# Patient Record
Sex: Female | Born: 1960 | Race: White | Hispanic: No | Marital: Married | Smoking: Former smoker
Health system: Southern US, Community
[De-identification: ages and names within clinical notes are randomized; demographics above are authoritative.]

## PROBLEM LIST (undated history)

## (undated) DIAGNOSIS — R87619 Unspecified abnormal cytological findings in specimens from cervix uteri: Secondary | ICD-10-CM

## (undated) DIAGNOSIS — N390 Urinary tract infection, site not specified: Secondary | ICD-10-CM

## (undated) DIAGNOSIS — I209 Angina pectoris, unspecified: Secondary | ICD-10-CM

## (undated) DIAGNOSIS — E119 Type 2 diabetes mellitus without complications: Secondary | ICD-10-CM

## (undated) DIAGNOSIS — K219 Gastro-esophageal reflux disease without esophagitis: Secondary | ICD-10-CM

## (undated) DIAGNOSIS — IMO0002 Reserved for concepts with insufficient information to code with codable children: Secondary | ICD-10-CM

## (undated) DIAGNOSIS — J40 Bronchitis, not specified as acute or chronic: Secondary | ICD-10-CM

## (undated) DIAGNOSIS — F419 Anxiety disorder, unspecified: Secondary | ICD-10-CM

## (undated) DIAGNOSIS — J339 Nasal polyp, unspecified: Secondary | ICD-10-CM

## (undated) DIAGNOSIS — M1990 Unspecified osteoarthritis, unspecified site: Secondary | ICD-10-CM

## (undated) DIAGNOSIS — M25552 Pain in left hip: Secondary | ICD-10-CM

## (undated) DIAGNOSIS — J342 Deviated nasal septum: Secondary | ICD-10-CM

## (undated) DIAGNOSIS — K589 Irritable bowel syndrome without diarrhea: Secondary | ICD-10-CM

## (undated) DIAGNOSIS — I10 Essential (primary) hypertension: Secondary | ICD-10-CM

## (undated) HISTORY — DX: Irritable bowel syndrome, unspecified: K58.9

## (undated) HISTORY — PX: TUBAL LIGATION: SHX77

## (undated) HISTORY — DX: Gastro-esophageal reflux disease without esophagitis: K21.9

## (undated) HISTORY — DX: Unspecified abnormal cytological findings in specimens from cervix uteri: R87.619

## (undated) HISTORY — DX: Reserved for concepts with insufficient information to code with codable children: IMO0002

## (undated) HISTORY — PX: ABDOMINAL HYSTERECTOMY: SHX81

## (undated) HISTORY — PX: CERVICAL CONE BIOPSY: SUR198

## (undated) HISTORY — PX: ANTERIOR CERVICAL DECOMP/DISCECTOMY FUSION: SHX1161

---

## 1999-05-03 ENCOUNTER — Other Ambulatory Visit: Admission: RE | Admit: 1999-05-03 | Discharge: 1999-05-03 | Payer: Self-pay | Admitting: Obstetrics and Gynecology

## 2001-02-14 ENCOUNTER — Other Ambulatory Visit: Admission: RE | Admit: 2001-02-14 | Discharge: 2001-02-14 | Payer: Self-pay | Admitting: Obstetrics and Gynecology

## 2001-04-13 ENCOUNTER — Ambulatory Visit (HOSPITAL_COMMUNITY): Admission: RE | Admit: 2001-04-13 | Discharge: 2001-04-13 | Payer: Self-pay | Admitting: Family Medicine

## 2001-04-13 ENCOUNTER — Encounter: Payer: Self-pay | Admitting: Family Medicine

## 2001-07-05 ENCOUNTER — Inpatient Hospital Stay (HOSPITAL_COMMUNITY): Admission: AD | Admit: 2001-07-05 | Discharge: 2001-07-05 | Payer: Self-pay | Admitting: Obstetrics and Gynecology

## 2002-03-25 ENCOUNTER — Other Ambulatory Visit: Admission: RE | Admit: 2002-03-25 | Discharge: 2002-03-25 | Payer: Self-pay | Admitting: *Deleted

## 2003-02-01 ENCOUNTER — Emergency Department (HOSPITAL_COMMUNITY): Admission: EM | Admit: 2003-02-01 | Discharge: 2003-02-01 | Payer: Self-pay | Admitting: Emergency Medicine

## 2003-02-01 ENCOUNTER — Encounter: Payer: Self-pay | Admitting: Emergency Medicine

## 2003-05-26 ENCOUNTER — Encounter: Payer: Self-pay | Admitting: Family Medicine

## 2003-05-26 ENCOUNTER — Ambulatory Visit (HOSPITAL_COMMUNITY): Admission: RE | Admit: 2003-05-26 | Discharge: 2003-05-26 | Payer: Self-pay | Admitting: Family Medicine

## 2003-06-10 ENCOUNTER — Other Ambulatory Visit: Admission: RE | Admit: 2003-06-10 | Discharge: 2003-06-10 | Payer: Self-pay | Admitting: Obstetrics and Gynecology

## 2003-08-31 ENCOUNTER — Ambulatory Visit (HOSPITAL_COMMUNITY): Admission: RE | Admit: 2003-08-31 | Discharge: 2003-08-31 | Payer: Self-pay | Admitting: Obstetrics and Gynecology

## 2003-09-04 ENCOUNTER — Encounter: Admission: RE | Admit: 2003-09-04 | Discharge: 2003-09-04 | Payer: Self-pay | Admitting: Obstetrics and Gynecology

## 2004-07-07 ENCOUNTER — Other Ambulatory Visit: Admission: RE | Admit: 2004-07-07 | Discharge: 2004-07-07 | Payer: Self-pay | Admitting: Obstetrics and Gynecology

## 2004-10-03 ENCOUNTER — Encounter (INDEPENDENT_AMBULATORY_CARE_PROVIDER_SITE_OTHER): Payer: Self-pay | Admitting: *Deleted

## 2004-10-04 ENCOUNTER — Inpatient Hospital Stay (HOSPITAL_COMMUNITY): Admission: RE | Admit: 2004-10-04 | Discharge: 2004-10-05 | Payer: Self-pay | Admitting: Obstetrics and Gynecology

## 2006-10-04 ENCOUNTER — Encounter: Admission: RE | Admit: 2006-10-04 | Discharge: 2006-10-04 | Payer: Self-pay | Admitting: Obstetrics and Gynecology

## 2006-10-18 ENCOUNTER — Other Ambulatory Visit: Admission: RE | Admit: 2006-10-18 | Discharge: 2006-10-18 | Payer: Self-pay | Admitting: Obstetrics and Gynecology

## 2007-09-17 ENCOUNTER — Encounter: Admission: RE | Admit: 2007-09-17 | Discharge: 2007-09-17 | Payer: Self-pay | Admitting: Obstetrics and Gynecology

## 2010-03-11 ENCOUNTER — Emergency Department (HOSPITAL_COMMUNITY): Admission: EM | Admit: 2010-03-11 | Discharge: 2010-03-11 | Payer: Self-pay | Admitting: Family Medicine

## 2010-09-17 ENCOUNTER — Encounter: Payer: Self-pay | Admitting: Obstetrics and Gynecology

## 2010-11-12 LAB — POCT URINALYSIS DIP (DEVICE)
Ketones, ur: NEGATIVE mg/dL
Nitrite: POSITIVE — AB
Urobilinogen, UA: 0.2 mg/dL (ref 0.0–1.0)
pH: 5 (ref 5.0–8.0)

## 2010-12-22 ENCOUNTER — Other Ambulatory Visit (HOSPITAL_COMMUNITY): Payer: Self-pay | Admitting: Neurosurgery

## 2010-12-22 ENCOUNTER — Encounter (HOSPITAL_COMMUNITY)
Admission: RE | Admit: 2010-12-22 | Discharge: 2010-12-22 | Disposition: A | Discharge status: Home / Self Care | Payer: 59 | Source: Ambulatory Visit | Attending: Neurosurgery | Admitting: Neurosurgery

## 2010-12-22 DIAGNOSIS — M5020 Other cervical disc displacement, unspecified cervical region: Secondary | ICD-10-CM

## 2010-12-22 LAB — BASIC METABOLIC PANEL
BUN: 15 mg/dL (ref 6–23)
CO2: 29 mEq/L (ref 19–32)
Calcium: 9.9 mg/dL (ref 8.4–10.5)
Chloride: 99 mEq/L (ref 96–112)
Creatinine, Ser: 0.71 mg/dL (ref 0.4–1.2)
GFR calc non Af Amer: >60 mL/min (ref >60)

## 2010-12-22 LAB — SURGICAL PCR SCREEN
MRSA, PCR: NEGATIVE
Staphylococcus aureus: NEGATIVE

## 2010-12-22 LAB — CBC
MCH: 30.9 pg (ref 26.0–34.0)
MCHC: 34.8 g/dL (ref 30.0–36.0)
MCV: 88.9 fL (ref 78.0–100.0)

## 2010-12-26 ENCOUNTER — Ambulatory Visit (HOSPITAL_COMMUNITY): Payer: 59

## 2010-12-26 ENCOUNTER — Observation Stay (HOSPITAL_COMMUNITY)
Admission: RE | Admit: 2010-12-26 | Discharge: 2010-12-27 | Disposition: A | Discharge status: Home / Self Care | Payer: 59 | Source: Ambulatory Visit | Attending: Neurosurgery | Admitting: Neurosurgery

## 2010-12-26 DIAGNOSIS — Z01818 Encounter for other preprocedural examination: Secondary | ICD-10-CM | POA: Insufficient documentation

## 2010-12-26 DIAGNOSIS — M5000 Cervical disc disorder with myelopathy, unspecified cervical region: Principal | ICD-10-CM | POA: Insufficient documentation

## 2010-12-26 DIAGNOSIS — I10 Essential (primary) hypertension: Secondary | ICD-10-CM | POA: Insufficient documentation

## 2010-12-26 DIAGNOSIS — Z01810 Encounter for preprocedural cardiovascular examination: Secondary | ICD-10-CM | POA: Insufficient documentation

## 2010-12-26 DIAGNOSIS — E119 Type 2 diabetes mellitus without complications: Secondary | ICD-10-CM | POA: Insufficient documentation

## 2010-12-26 DIAGNOSIS — Z87891 Personal history of nicotine dependence: Secondary | ICD-10-CM | POA: Insufficient documentation

## 2010-12-26 DIAGNOSIS — K219 Gastro-esophageal reflux disease without esophagitis: Secondary | ICD-10-CM | POA: Insufficient documentation

## 2010-12-26 DIAGNOSIS — M4712 Other spondylosis with myelopathy, cervical region: Secondary | ICD-10-CM | POA: Insufficient documentation

## 2010-12-26 DIAGNOSIS — Z01812 Encounter for preprocedural laboratory examination: Secondary | ICD-10-CM | POA: Insufficient documentation

## 2010-12-26 LAB — GLUCOSE, CAPILLARY
Glucose-Capillary: 170 mg/dL — ABNORMAL HIGH (ref 70–99)
Glucose-Capillary: 118 mg/dL — ABNORMAL HIGH (ref 70–99)

## 2010-12-27 LAB — GLUCOSE, CAPILLARY: Glucose-Capillary: 153 mg/dL — ABNORMAL HIGH (ref 70–99)

## 2011-01-13 NOTE — Op Note (Signed)
NAME:  Barbara Lynch, Barbara Lynch               ACCOUNT NO.:  [id number]   MEDICAL RECORD NO.:  [id number]          PATIENT TYPE:  OBV   LOCATION:  9399                          FACILITY:  [facility]   PHYSICIAN:  Cynthia P. Romine, M.D.DATE OF BIRTH:  [birth year]   DATE OF PROCEDURE:  10/03/2004  DATE OF DISCHARGE:                                 OPERATIVE REPORT   PREOPERATIVE DIAGNOSES:  1.  Menorrhagia.  2.  Pelvic pain.  3.  Uterine fibroids.  4.  Bilateral ovarian cysts.   POSTOPERATIVE DIAGNOSES:  1.  Menorrhagia.  2.  Pelvic pain.  3.  Uterine fibroids.  4.  Bilateral paratubal cysts.   PROCEDURE:  1.  Total abdominal hysterectomy.  2.  Bilateral distal salpingectomy.   SURGEON:  Cynthia P. Romine, M.D.   ASSISTANT:  Andres Ege, M.D.   ANESTHESIA:  General endotracheal.   ESTIMATED BLOOD LOSS:  100 cc.   COMPLICATIONS:  None.   PROCEDURE:  The patient was taken to the operating room and, after the  induction of adequate general endotracheal anesthesia, was prepped and  draped in the usual fashion and a Foley catheter inserted.  A Pfannenstiel  incision was made and carried down to the fascia using the Bovie.  The  fascia was nicked and opened transversely.  Kochers were used to grasp the  fascial margins and were separated from the underlying rectus muscles using  cautery.  The rectus muscles were separated sharply in the midline.  The  underlying peritoneum was entered atraumatically and opened vertically.  The  upper abdomen was explored.  The liver edge was smoothed.  The gallbladder  contained no stones.  There was no periaortic or pelvic adenopathy.  In the  pelvis, the uterus was slightly enlarged.  There was an approximately 2 cm  posterior fundal fibroid.  The ovaries on both sides appeared normal.  The  tubes on both sides had clear paratubal cysts at the very distal ends.  The  one on the right was approximately 1.5 cm and the one on the left  approximately 2  cm.  The Balfour retractor was placed.  The intestines were  packed away.  The uterus was grasped at its cornu with long Kellys.  The  round ligaments on either side were switched and then divided with the  Bovie.  The anterior leaf of the broad ligament was taken down sharply.  The  pedicle containing the utero-ovarian ligaments and the tube was clamped,  cut, and doubly tied on each side.  The ureters were identified and brought  out of the operative field.  The uterine arteries were skeletonized.  The  bladder was taken down sharply.  The uterine arteries were clamped, cut, and  doubly tied on each side.  The hysterectomy continued down the cardinal  ligament in clamping, cutting, and tying sequence.  The uterosacral  ligaments were taken as a separate pedicle.  On the right, the distal end of  the cardinal ligament was clamped.  The vagina was entered.  Specimen was  removed with Mayo scissors.  Angles of sutures  were placed through each of  the right and left angle of the vagina.  The vagina was closed with  interrupted figure-of-eight sutures of 0 chromic.  Good hemostasis was  noted.  The pelvis was irrigated.   Attention was next turned to the tubes.  A Kelly clamp was used to clamp  across the distal end of each tube so that the fimbria and the paratubal  cysts were incorporated as a specimen.  The specimens were removed and sent  to pathology.  The pedicles were doubly tied with 0 chromic.  Good  hemostasis was noted.  The ovarian pedicles were then tied up to the round  ligament pedicles to try and prevent them from falling into the cul-de-sac  and causing dyspareunia.   Attention was next turned back into the pelvis.  The pelvis was hemostatic.  The Balfour retractor was removed.  The caps were removed.  The peritoneum  was grasped with hemostats and closed in a running fashion using 2-0  chromic.  The fascia was closed from each angle towards the midline using 0  Vicryl.  An  ON-Q catheter was placed in the subfascial space prior to  closing the fascia.  The subcutaneous tissue was irrigated.  Hemostasis was  achieved with the Bovie.  A subcu ON-Q catheter was placed.  The skin was  then closed with a subcuticular stitch of 4-0 Vicryl.  Sponge and Steri-  Strips were applied and a dressing was applied.  The procedure was  terminated.  Sponge, needle, and instrument counts were correct x3.      CPR/MEDQ  D:  10/03/2004  T:  10/03/2004  Job:  [id number]

## 2011-01-13 NOTE — Discharge Summary (Signed)
NAME:  Barbara Lynch, Barbara Lynch               ACCOUNT NO.:  [id number]   MEDICAL RECORD NO.:  [id number]          PATIENT TYPE:  INP   LOCATION:  9319                          FACILITY:  [facility]   PHYSICIAN:  Cynthia P. Romine, M.D.DATE OF BIRTH:  [birth year]   DATE OF ADMISSION:  10/04/2004  DATE OF DISCHARGE:  10/05/2004                                 DISCHARGE SUMMARY   DISCHARGE DIAGNOSES:  Adenomyosis, leiomyoma, benign cystadenofibroma of the  right fallopian tube and a benign paratubal cyst of the left fallopian tube.   HISTORY:  This is a 50 year old, married white female, gravida 2, para 2  with menorrhagia unresponsive to medical management. Her uterus is known to  be 9 cm with a 2.1 cm fibroid and ovaries both had up to 2 cm cyst that was  all consistent with endometriosis. The patient elected definitive surgery.  Her history was positive for C sections x2 and a BTSP.  She was 5 foot 4 and  226 pounds on exam.  On October 03, 2004, the patient was taken to the  operating room and underwent a total abdominal hysterectomy and a bilateral  distal salpingectomy. The uterus was enlarged, there was a 2 cm posterior  fundal fibroid, the tubes both had clear paratubal cysts at the distal end.  The estimated blood loss for the surgery was 100 mL.  Postoperatively, the  patient did very well.  Pathology report did confirm adenomyosis, leiomyoma  and the benign bilateral tubal cyst.  The patient was sent home on her  second postoperative day and had an uneventful recovery. She was given full  discharge instructions regarding pelvic rest and followup in the office. She  elected to use Motrin 800 mg p.o. t.i.d. p.r.n. for pain.   LABORATORY DATA:  Admission H&H 12 and 37, on discharge 11 and 32.      CPR/MEDQ  D:  10/20/2004  T:  10/20/2004  Job:  [id number]

## 2011-01-18 NOTE — Op Note (Signed)
NAME:  Barbara Lynch, Barbara Lynch NO.:  [id number]  MEDICAL RECORD NO.:  [id number]           PATIENT TYPE:  O  LOCATION:  3533                         FACILITY:  [facility]  PHYSICIAN:  Cristi Loron, M.D.DATE OF BIRTH:  Sep 14, 1960  DATE OF PROCEDURE:  12/26/2010 DATE OF DISCHARGE:                              OPERATIVE REPORT   BRIEF HISTORY:  The patient is a white female who has suffered from neck and arm pain consistent with cervical radiculopathy.  She has failed medical management, was worked up with a cervical MRI, which demonstrated she had spondylosis, stenosis etc., at C5-6.  I discussed the various treatment options with the patient and her husband including surgery.  She has weighed the risks, benefits, and alternatives of surgery and decided to proceed with a C5-6 anterior cervical diskectomy, fusion, plating.  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  PROCEDURE:  C5-6 extensive anterior cervical diskectomy/decompression; C5-6 anterior interbody arthrodesis with local morselized autograft bone and Actifuse bone graft extender; insertion of C5-6 interbody prosthesis (Zimmer PEEK interbody prosthesis), C5-6 anterior cervical plating with Globus titanium plate and screws.  SURGEON:  Cristi Loron, MD  ASSISTANT:  Clydene Fake, MD  ANESTHESIA:  General endotracheal.  ESTIMATED BLOOD LOSS:  75 mL.  SPECIMENS:  None.  DRAINS:  None.  COMPLICATIONS:  None.  DESCRIPTION OF PROCEDURE:  The patient was brought to the operating room by the anesthesia team.  General endotracheal anesthesia was induced. The patient remained in supine position.  A roll was placed under shoulders to keep her neck in neutral position.  Her anterior cervical region was  then prepared with Betadine scrub and Betadine solution. Sterile drapes were applied.  I then injected the area to be incised with Marcaine with epinephrine solution.  I used a scalpel to make a transverse incision in the patient's left anterior neck.  I used Metzenbaum scissors to divide the platysma muscle and then to dissect medial sternocleidomastoid muscle, jugular vein, and carotid artery.  I carefully dissected down towards the anterior cervical spine identifying the esophagus, retracting it medially.  I then used Kittner swabs to clear it from anterior cervical spine and then inserted a bent spinal needle into the upper exposed intervertebral disk space.  We obtained intraoperative radiograph from our location.  We then used electrocautery to detach the medial border of the longus colli muscle bilaterally from C5-6 intervertebral disk space.  We inserted the Caspar self-retaining retractor underneath the longus colli muscle bilaterally to provide exposure.  We began decompression by incising C5-6 intervertebral disk with a scalpel.  We performed a partial intervertebral diskectomy with a pituitary forceps and Carlen curettes.  We then inserted traction screws at C5-C6, distracted interspace, and then used high-speed drill to decorticate the vertebral endplates at C5-6, drill away the remainder of C5-6 intervertebral disk to drill away some posterior spondylosis and to thin out the posterior longitudinal ligament.  We then incised the ligament with arachnoid knife and removed it with a Kerrison punch undercutting the  vertebral endplates at C5-6 decompressing the thecal sac.  We then performed foraminotomy about the bilateral C6 nerve roots.  We now turned attention to arthrodesis.  We used trial spacers and determined to use an 8-mm interbody prosthesis.  We prefilled prosthesis with a combination of local morselized autograft bone and Actifuse bone graft extender.  We then  inserted prosthesis into distracted interspace at C5-6 and then removed distraction screws.  There was a good snug fit of prosthesis in the interspace.  We then turned our attention to the anterior spinal instrumentation.  We used a high-speed drill to remove some ventral spondylosis from the vertebral endplates at C5-6 so that the plate would lie down flat.  We selected appropriate length Globus Extends anterior cervical plate and laid along the anterior aspect of the vertebral bodies at C5-C6.  We then drilled two 12-mm holes at C5, 2 at C6, and then secured the plate to the vertebral bodies by placing two 12-mm self-tapping screws at C5 and 2 at C6.  After the screws were put in there, we got good bony purchase with each screw.  We then obtained an intraoperative radiograph, which demonstrated good position of plate, screws, and interbody prosthesis at C5-6.  We therefore secured the screws and plate by locking each cam.  This completed the instrumentation.  We then obtained hemostasis using bipolar electrocautery.  We irrigated the wound out with bacitracin solution.  We then removed the retractor. We inspected the esophagus for any damage and there was not apparent. We then reapproximated the patient's platysma muscle with interrupted 3- 0 Vicryl suture, subcutaneous tissue with interrupted 3-0 Vicryl suture, and skin with Steri-Strips and Benzoin.  The wound was then coated with bacitracin ointment, sterile dressing applied, drapes were removed, and the patient was subsequently extubated by the anesthesia team and transported to post anesthesia care unit in stable condition.  All sponge, instrument, and needle counts were correct at the end of this case.     Cristi Loron, M.D.     JDJ/MEDQ  D:  12/26/2010  T:  12/27/2010  Job:  [id number]  Electronically Signed by Tressie Stalker M.D. on 01/18/2011 02:21:00 PM

## 2012-07-03 ENCOUNTER — Other Ambulatory Visit: Payer: Self-pay | Admitting: Gastroenterology

## 2012-07-03 DIAGNOSIS — R1032 Left lower quadrant pain: Secondary | ICD-10-CM

## 2012-07-08 ENCOUNTER — Ambulatory Visit
Admission: RE | Admit: 2012-07-08 | Discharge: 2012-07-08 | Disposition: A | Discharge status: Home / Self Care | Payer: 59 | Source: Ambulatory Visit | Attending: Gastroenterology | Admitting: Gastroenterology

## 2012-07-08 DIAGNOSIS — R1032 Left lower quadrant pain: Secondary | ICD-10-CM

## 2012-07-08 MED ORDER — IOHEXOL 300 MG/ML  SOLN
125.0000 mL | Freq: Once | INTRAMUSCULAR | Status: AC | PRN
  Administered 2012-07-08: 125 mL via INTRAVENOUS

## 2012-07-09 ENCOUNTER — Other Ambulatory Visit: Payer: Self-pay | Admitting: Gastroenterology

## 2012-07-09 DIAGNOSIS — K760 Fatty (change of) liver, not elsewhere classified: Secondary | ICD-10-CM

## 2012-07-18 ENCOUNTER — Encounter: Payer: Self-pay | Admitting: Gynecologic Oncology

## 2012-07-18 ENCOUNTER — Ambulatory Visit: Payer: 59 | Attending: Gynecologic Oncology | Admitting: Gynecologic Oncology

## 2012-07-18 VITALS — BP 136/80 | HR 80 | Temp 98.0°F | Resp 16 | Ht 64.0 in | Wt 207.0 lb

## 2012-07-18 DIAGNOSIS — R1900 Intra-abdominal and pelvic swelling, mass and lump, unspecified site: Secondary | ICD-10-CM

## 2012-07-18 DIAGNOSIS — C569 Malignant neoplasm of unspecified ovary: Secondary | ICD-10-CM | POA: Insufficient documentation

## 2012-07-18 NOTE — Patient Instructions (Addendum)
Exploratory laparotomy with left salpingo-oophorectomy right salpingectomy, and removal  of the contralateral ovary only as indicated. The procedure will be completed by Dr. De Blanch and Dr. Tamela Oddi on July 30, 2012.  Thank you very much Ms. Barbara Lynch for allowing me to provide care for you today.  I appreciate your confidence in choosing our Gynecologic Oncology team.  If you have any questions about your visit today please call our office and we will get back to you as soon as possible.  [name]. [name] MD., PhD Gynecologic Oncology

## 2012-07-18 NOTE — Progress Notes (Signed)
Consult Note: Gyn-Onc  Consult was requested by Dr. Romine for the evaluation of Barbara Lynch 51 y.o. female  CC:  Chief Complaint  Patient presents with  . Ovarian Cancer    New consult    HPI: 51 y/o G3P2 LNMP 20o2.  Hysterectomy  2002 for menorrhagia. Patient reports intermittent diarrhea/constipation and post pradial LLQ  pelvic pressure for 3-4 years. Barbara Lynch reports fair appetite, early satiety and abdominal bloating. Reports a 12 pound weight loss in the past 18 months with minor diet modifications. A CT scan of the abdomen and pelvis with contrast was collected on 07/08/2012. It was notable for a left pelvic cystic septated lesion measuring 9.0 x 5.2 x 9.0cm. Some septations appeared thickened. No fluid is noted within the cul-de-sac and scattered rectosigmoid colonic diverticula were present.  CA 125 is 6.3  Current Meds:  No outpatient encounter prescriptions on file as of 07/18/2012.    Allergy:  Allergies  Allergen Reactions  . Flagyl (Metronidazole)     Nausea and sea sickness    Social Hx:   History   Social History  . Marital Status: Married    Spouse Name: N/A    Number of Children: N/A  . Years of Education: N/A   Occupational History  . Not on file.   Social History Main Topics  . Smoking status: Former Smoker    Types: Cigarettes    Quit date: 07/18/1998  . Smokeless tobacco: Not on file  . Alcohol Use: Yes     Comment: Occ  . Drug Use: No  . Sexually Active: Yes   Other Topics Concern  . Not on file   Social History Narrative  . No narrative on file    Past Surgical Hx:  TAH 2002 C/S x 2, Repair of spinal stenosis 2011. CKC 1988 BTL [place]ulka clips 1987  Past Medical Hx: NIDDM 2010    HTN 2010    Bronchitis 05/30/2012  Past Gynecological History:  G3 P2 Menoarche 14, regular menses.  H/o menorrhagia with hysterectomy 2002.  Pap 07/10/2012.   Abnormal pap 1988 tx with CKC.     Family Hx: No family history on file.  Review of  Systems:  Constitutional  Feels well, fair appetite. Skin/Breast  No rash, sores, jaundice, itching, dryness Cardiovascular  No chest pain, shortness of breath.  Pulmonary  No cough or wheeze.  Gastro Intestinal  No nausea, vomitting.  Intermittent diarrhoea. No bright red blood per rectum, Left lower pelvic pain.  .  Genito Urinary  No frequency, urgency, dysuria,no vaginal  Bleeding or discharge Musculo Skeletal  No myalgia, arthralgia, joint swelling or pain.  Left hip pain with radiation through the lateral aspect of the left leg. Neurologic  No weakness, numbness, change in gait,  Psychology  No depression, anxiety, insomnia. Occasional mood swings that lasts a few weeks.  Vitals:  Blood pressure 136/80, pulse 80, temperature 98 F (36.7 C), temperature source Oral, resp. rate 16, height 5' 4" (1.626 m), weight 207 lb (93.895 kg).  Physical Exam: WD in NAD Neck  Supple NROM, without any enlargements.  Lymph Node Survey No cervical supraclavicular or inguinal adenopathy Cardiovascular  Pulse normal rate, regularity and rhythm. S1 and S2 normal.  Lungs  Clear to auscultation bilateraly, without wheezes/crackles/rhonchi. Good air movement.  Skin  No rash/lesions/breakdown  Psychiatry  Alert and oriented to person, place, and time  Abdomen  Normoactive bowel sounds, abdomen soft, non-tender and obese. Multiple vertical incisions without evidence   of hernia.  Back No CVA tenderness Genito Urinary  Vulva/vagina: Normal external female genitalia.  No lesions. No discharge or bleeding.  Bladder/urethra:  No lesions or masses  Vagina: Well estrogenized. No palpable masses.   Adnexa: No palpable masses. No cul de sac nodularity Rectal  Good tone, no masses no cul de sac nodularity.  Extremities  No bilateral cyanosis, clubbing or edema.   Assessment/Plan:  Ms. Barbara Lynch  is a 51 y.o.  year old with a 9 x 9 x 5.2 cm left pelvic mass. The patient reports years of  left lower quadrant discomfort however her most troublesome complaint is that of the left hip pain with radiation to the knee.  The recommendation presented to Barbara Lynch  was exploratory laparotomy with left salpingo-oophorectomy right salpingectomy, and removal  of the contralateral ovary only as indicated. In the event that malignancy is encountered the procedure will be inclusive of omentectomy lymph node dissection debulking and other indicated procedures. The risks and benefits of the procedure discussed with the patient and her husband were that of infection bleeding damage to surrounding structures prolonged hospitalization and reoperation. The patient and her husband are aware that procedure will be completed by Dr. Daniel Clarke Pearson and Dr. Jackson-Moore on July 30, 2012.  The patient was strongly counseled that a left salpingo-oophorectomy would likely not alleviate the left hip pain with radiation to the knee.  Questions were answered to their satisfaction.    Alesana Magistro, MD, PhD 07/18/2012, 12:01 PM   

## 2012-07-19 ENCOUNTER — Encounter (HOSPITAL_COMMUNITY): Payer: Self-pay | Admitting: Pharmacy Technician

## 2012-07-23 NOTE — Patient Instructions (Signed)
20 Barbara Lynch  07/23/2012   Your procedure is scheduled on: 07/30/12 1145am-145pm  Report to [facility] at 0915 AM.  Call this number if you have problems the morning of surgery: [phone]   Remember:   Do not eat food:After Midnight.  May have clear liquids:until Midnight .    Take these medicines the morning of surgery with A SIP OF WATER:    Do not wear jewelry, make-up or nail polish.  Do not wear lotions, powders, or perfumes. .  Do not shave 48 hours prior to surgery.   Do not bring valuables to the hospital.  Contacts, dentures or bridgework may not be worn into surgery.  Leave suitcase in the car. After surgery it may be brought to your room.  For patients admitted to the hospital, checkout time is 11:00 AM the day of discharge.              SEE CHG INSTRUCTION SHEET    Please read over the following fact sheets that you were given: MRSA Information, Incentive Spirometry FAct Sheet, coughing and deep breathing exercises, leg exercises, Blood Transfusion Fact Sheet

## 2012-07-24 ENCOUNTER — Ambulatory Visit (HOSPITAL_COMMUNITY)
Admission: RE | Admit: 2012-07-24 | Discharge: 2012-07-24 | Disposition: A | Discharge status: Home / Self Care | Payer: 59 | Source: Ambulatory Visit | Attending: Gynecology | Admitting: Gynecology

## 2012-07-24 ENCOUNTER — Encounter (HOSPITAL_COMMUNITY): Payer: Self-pay

## 2012-07-24 ENCOUNTER — Encounter (HOSPITAL_COMMUNITY)
Admission: RE | Admit: 2012-07-24 | Discharge: 2012-07-24 | Disposition: A | Discharge status: Home / Self Care | Payer: 59 | Source: Ambulatory Visit | Attending: Gynecology | Admitting: Gynecology

## 2012-07-24 HISTORY — DX: Urinary tract infection, site not specified: N39.0

## 2012-07-24 HISTORY — DX: Anxiety disorder, unspecified: F41.9

## 2012-07-24 HISTORY — DX: Type 2 diabetes mellitus without complications: E11.9

## 2012-07-24 HISTORY — DX: Nasal polyp, unspecified: J33.9

## 2012-07-24 HISTORY — DX: Deviated nasal septum: J34.2

## 2012-07-24 HISTORY — DX: Angina pectoris, unspecified: I20.9

## 2012-07-24 HISTORY — DX: Essential (primary) hypertension: I10

## 2012-07-24 HISTORY — DX: Bronchitis, not specified as acute or chronic: J40

## 2012-07-24 HISTORY — DX: Unspecified osteoarthritis, unspecified site: M19.90

## 2012-07-24 HISTORY — DX: Pain in left hip: M25.552

## 2012-07-24 LAB — CBC WITH DIFFERENTIAL/PLATELET
Basophils Absolute: 0.1 10*3/uL (ref 0.0–0.1)
Lymphocytes Relative: 30 % (ref 12–46)
Lymphs Abs: 3.6 10*3/uL (ref 0.7–4.0)
MCV: 88 fL (ref 78.0–100.0)
Neutro Abs: 7.1 10*3/uL (ref 1.7–7.7)
Platelets: 267 10*3/uL (ref 150–400)
RBC: 4.4 MIL/uL (ref 3.87–5.11)
RDW: 12.5 % (ref 11.5–15.5)
WBC: 11.8 10*3/uL — ABNORMAL HIGH (ref 4.0–10.5)

## 2012-07-24 LAB — COMPREHENSIVE METABOLIC PANEL
ALT: 38 U/L — ABNORMAL HIGH (ref 0–35)
AST: 27 U/L (ref 0–37)
Alkaline Phosphatase: 64 U/L (ref 39–117)
CO2: 26 mEq/L (ref 19–32)
Chloride: 103 mEq/L (ref 96–112)
GFR calc Af Amer: >90 mL/min (ref >90)
GFR calc non Af Amer: >90 mL/min (ref >90)
Glucose, Bld: 121 mg/dL — ABNORMAL HIGH (ref 70–99)
Potassium: 4.1 mEq/L (ref 3.5–5.1)
Sodium: 140 mEq/L (ref 135–145)
Total Bilirubin: 0.6 mg/dL (ref 0.3–1.2)

## 2012-07-24 LAB — ABO/RH: ABO/RH(D): A NEG

## 2012-07-24 NOTE — Progress Notes (Signed)
Patient unsure of wording of consent form since she has had abdominal hysterectomy in the past.  Paged Telford Nab RN .  No answer.  Instructed patient that would be addressed on day of surgery.  Patient voiced understanding.

## 2012-07-29 MED ORDER — CEFAZOLIN SODIUM-DEXTROSE 2-3 GM-% IV SOLR
2.0000 g | INTRAVENOUS | Status: AC
  Administered 2012-07-30: 2 g via INTRAVENOUS

## 2012-07-30 ENCOUNTER — Inpatient Hospital Stay (HOSPITAL_COMMUNITY)
Admission: RE | Admit: 2012-07-30 | Discharge: 2012-08-01 | DRG: 743 | Disposition: A | Discharge status: Home / Self Care | Payer: 59 | Source: Ambulatory Visit | Attending: Obstetrics & Gynecology | Admitting: Obstetrics & Gynecology

## 2012-07-30 ENCOUNTER — Ambulatory Visit (HOSPITAL_COMMUNITY): Payer: 59 | Admitting: Anesthesiology

## 2012-07-30 ENCOUNTER — Encounter (HOSPITAL_COMMUNITY): Payer: Self-pay | Admitting: Anesthesiology

## 2012-07-30 ENCOUNTER — Encounter (HOSPITAL_COMMUNITY): Payer: Self-pay | Admitting: *Deleted

## 2012-07-30 ENCOUNTER — Encounter (HOSPITAL_COMMUNITY)
Admission: RE | Disposition: A | Discharge status: Home / Self Care | Payer: Self-pay | Source: Ambulatory Visit | Attending: Obstetrics & Gynecology

## 2012-07-30 DIAGNOSIS — D279 Benign neoplasm of unspecified ovary: Principal | ICD-10-CM | POA: Diagnosis present

## 2012-07-30 DIAGNOSIS — I10 Essential (primary) hypertension: Secondary | ICD-10-CM | POA: Diagnosis present

## 2012-07-30 DIAGNOSIS — Z79899 Other long term (current) drug therapy: Secondary | ICD-10-CM

## 2012-07-30 DIAGNOSIS — K660 Peritoneal adhesions (postprocedural) (postinfection): Secondary | ICD-10-CM | POA: Diagnosis present

## 2012-07-30 DIAGNOSIS — E119 Type 2 diabetes mellitus without complications: Secondary | ICD-10-CM | POA: Diagnosis present

## 2012-07-30 DIAGNOSIS — R1900 Intra-abdominal and pelvic swelling, mass and lump, unspecified site: Secondary | ICD-10-CM | POA: Diagnosis present

## 2012-07-30 HISTORY — PX: SALPINGOOPHORECTOMY: SHX82

## 2012-07-30 LAB — TYPE AND SCREEN

## 2012-07-30 LAB — GLUCOSE, CAPILLARY: Glucose-Capillary: 155 mg/dL — ABNORMAL HIGH (ref 70–99)

## 2012-07-30 SURGERY — SALPINGO-OOPHORECTOMY, OPEN
Anesthesia: General | Laterality: Left | Wound class: Clean Contaminated

## 2012-07-30 MED ORDER — HYDROMORPHONE HCL PF 1 MG/ML IJ SOLN
INTRAMUSCULAR | Status: AC
  Filled 2012-07-30: qty 1

## 2012-07-30 MED ORDER — HYDROMORPHONE HCL PF 1 MG/ML IJ SOLN
INTRAMUSCULAR | Status: DC | PRN
  Administered 2012-07-30 (×4): 0.5 mg via INTRAVENOUS

## 2012-07-30 MED ORDER — EPHEDRINE SULFATE 50 MG/ML IJ SOLN
INTRAMUSCULAR | Status: DC | PRN
  Administered 2012-07-30: 10 mg via INTRAVENOUS

## 2012-07-30 MED ORDER — LIDOCAINE HCL (CARDIAC) 20 MG/ML IV SOLN
INTRAVENOUS | Status: DC | PRN
  Administered 2012-07-30: 20 mg via INTRAVENOUS

## 2012-07-30 MED ORDER — LACTATED RINGERS IV SOLN
INTRAVENOUS | Status: DC
  Administered 2012-07-30: 1000 mL via INTRAVENOUS

## 2012-07-30 MED ORDER — MAGNESIUM HYDROXIDE 400 MG/5ML PO SUSP
30.0000 mL | Freq: Three times a day (TID) | ORAL | Status: AC
  Administered 2012-07-30 – 2012-07-31 (×3): 30 mL via ORAL
  Filled 2012-07-30 (×3): qty 30

## 2012-07-30 MED ORDER — ONDANSETRON HCL 4 MG/2ML IJ SOLN
INTRAMUSCULAR | Status: DC | PRN
  Administered 2012-07-30 (×2): 2 mg via INTRAVENOUS

## 2012-07-30 MED ORDER — ONDANSETRON HCL 4 MG PO TABS
4.0000 mg | ORAL_TABLET | Freq: Four times a day (QID) | ORAL | Status: DC | PRN
  Administered 2012-07-31: 4 mg via ORAL
  Filled 2012-07-30: qty 1

## 2012-07-30 MED ORDER — BUPIVACAINE LIPOSOME 1.3 % IJ SUSP
20.0000 mL | Freq: Once | INTRAMUSCULAR | Status: AC
  Administered 2012-07-30: 40 mL
  Filled 2012-07-30: qty 20

## 2012-07-30 MED ORDER — ONDANSETRON HCL 4 MG/2ML IJ SOLN: 4.0000 mg | Freq: Four times a day (QID) | INTRAMUSCULAR | Status: DC | PRN

## 2012-07-30 MED ORDER — KCL IN DEXTROSE-NACL 20-5-0.45 MEQ/L-%-% IV SOLN
INTRAVENOUS | Status: DC
  Administered 2012-07-30 – 2012-07-31 (×2): via INTRAVENOUS
  Filled 2012-07-30 (×4): qty 1000

## 2012-07-30 MED ORDER — KCL IN DEXTROSE-NACL 20-5-0.45 MEQ/L-%-% IV SOLN
INTRAVENOUS | Status: AC
  Filled 2012-07-30: qty 1000

## 2012-07-30 MED ORDER — OXYCODONE-ACETAMINOPHEN 5-325 MG PO TABS
1.0000 | ORAL_TABLET | ORAL | Status: DC | PRN
  Administered 2012-07-31 (×3): 1 via ORAL
  Administered 2012-08-01 (×2): 2 via ORAL
  Filled 2012-07-30 (×2): qty 1
  Filled 2012-07-30 (×2): qty 2
  Filled 2012-07-30 (×2): qty 1

## 2012-07-30 MED ORDER — ACETAMINOPHEN 10 MG/ML IV SOLN
1000.0000 mg | Freq: Four times a day (QID) | INTRAVENOUS | Status: AC
  Administered 2012-07-30 – 2012-07-31 (×3): 1000 mg via INTRAVENOUS
  Filled 2012-07-30 (×4): qty 100

## 2012-07-30 MED ORDER — LACTATED RINGERS IV SOLN: INTRAVENOUS | Status: DC

## 2012-07-30 MED ORDER — MEPERIDINE HCL 50 MG/ML IJ SOLN: 6.2500 mg | INTRAMUSCULAR | Status: DC | PRN

## 2012-07-30 MED ORDER — HEPARIN SODIUM (PORCINE) 1000 UNIT/ML IJ SOLN
INTRAMUSCULAR | Status: AC
  Filled 2012-07-30: qty 1

## 2012-07-30 MED ORDER — NEOSTIGMINE METHYLSULFATE 1 MG/ML IJ SOLN
INTRAMUSCULAR | Status: DC | PRN
  Administered 2012-07-30: 3 mg via INTRAVENOUS

## 2012-07-30 MED ORDER — FENTANYL CITRATE 0.05 MG/ML IJ SOLN
INTRAMUSCULAR | Status: DC | PRN
  Administered 2012-07-30 (×3): 50 ug via INTRAVENOUS
  Administered 2012-07-30: 100 ug via INTRAVENOUS

## 2012-07-30 MED ORDER — CISATRACURIUM BESYLATE (PF) 10 MG/5ML IV SOLN
INTRAVENOUS | Status: DC | PRN
  Administered 2012-07-30: 3 mg via INTRAVENOUS
  Administered 2012-07-30: 7 mg via INTRAVENOUS

## 2012-07-30 MED ORDER — PROPOFOL 10 MG/ML IV EMUL
INTRAVENOUS | Status: DC | PRN
  Administered 2012-07-30: 180 mg via INTRAVENOUS

## 2012-07-30 MED ORDER — GLYCOPYRROLATE 0.2 MG/ML IJ SOLN
INTRAMUSCULAR | Status: DC | PRN
  Administered 2012-07-30: 0.4 mg via INTRAVENOUS
  Administered 2012-07-30: 0.2 mg via INTRAVENOUS

## 2012-07-30 MED ORDER — ZOLPIDEM TARTRATE 5 MG PO TABS
5.0000 mg | ORAL_TABLET | Freq: Every evening | ORAL | Status: DC | PRN
  Administered 2012-07-31: 5 mg via ORAL
  Filled 2012-07-30: qty 1

## 2012-07-30 MED ORDER — HYDROMORPHONE HCL PF 1 MG/ML IJ SOLN
0.2500 mg | INTRAMUSCULAR | Status: DC | PRN
  Administered 2012-07-30 (×4): 0.5 mg via INTRAVENOUS

## 2012-07-30 MED ORDER — SUCCINYLCHOLINE CHLORIDE 20 MG/ML IJ SOLN
INTRAMUSCULAR | Status: DC | PRN
  Administered 2012-07-30: 120 mg via INTRAVENOUS

## 2012-07-30 MED ORDER — ONDANSETRON HCL 4 MG/2ML IJ SOLN
4.0000 mg | Freq: Four times a day (QID) | INTRAMUSCULAR | Status: DC | PRN
Start: 2012-07-30 — End: 2012-07-30

## 2012-07-30 MED ORDER — LACTATED RINGERS IV SOLN
INTRAVENOUS | Status: DC | PRN
  Administered 2012-07-30 (×2): via INTRAVENOUS

## 2012-07-30 MED ORDER — 0.9 % SODIUM CHLORIDE (POUR BTL) OPTIME
TOPICAL | Status: DC | PRN
  Administered 2012-07-30: 2000 mL

## 2012-07-30 MED ORDER — DIPHENHYDRAMINE HCL 12.5 MG/5ML PO ELIX: 12.5000 mg | ORAL_SOLUTION | Freq: Four times a day (QID) | ORAL | Status: DC | PRN

## 2012-07-30 MED ORDER — LISINOPRIL 20 MG PO TABS
20.0000 mg | ORAL_TABLET | Freq: Every day | ORAL | Status: DC
  Administered 2012-07-30 – 2012-07-31 (×2): 20 mg via ORAL
  Filled 2012-07-30 (×3): qty 1

## 2012-07-30 MED ORDER — HYDROMORPHONE 0.3 MG/ML IV SOLN
INTRAVENOUS | Status: DC
  Administered 2012-07-30: 1.43 mg via INTRAVENOUS
  Administered 2012-07-30 – 2012-07-31 (×2): via INTRAVENOUS
  Administered 2012-07-31: 1.8 mg via INTRAVENOUS
  Administered 2012-07-31: 1.5 mg via INTRAVENOUS
  Filled 2012-07-30: qty 25

## 2012-07-30 MED ORDER — PROMETHAZINE HCL 25 MG/ML IJ SOLN: 6.2500 mg | INTRAMUSCULAR | Status: DC | PRN

## 2012-07-30 MED ORDER — CEFAZOLIN SODIUM-DEXTROSE 2-3 GM-% IV SOLR
INTRAVENOUS | Status: AC
  Filled 2012-07-30: qty 50

## 2012-07-30 MED ORDER — SODIUM CHLORIDE 0.9 % IJ SOLN: 9.0000 mL | INTRAMUSCULAR | Status: DC | PRN

## 2012-07-30 MED ORDER — ENOXAPARIN SODIUM 40 MG/0.4ML ~~LOC~~ SOLN
40.0000 mg | SUBCUTANEOUS | Status: AC
  Administered 2012-07-30: 40 mg via SUBCUTANEOUS
  Filled 2012-07-30: qty 0.4

## 2012-07-30 MED ORDER — NALOXONE HCL 0.4 MG/ML IJ SOLN: 0.4000 mg | INTRAMUSCULAR | Status: DC | PRN

## 2012-07-30 MED ORDER — DIPHENHYDRAMINE HCL 50 MG/ML IJ SOLN: 12.5000 mg | Freq: Four times a day (QID) | INTRAMUSCULAR | Status: DC | PRN

## 2012-07-30 MED ORDER — HYDROMORPHONE 0.3 MG/ML IV SOLN
INTRAVENOUS | Status: AC
  Filled 2012-07-30: qty 25

## 2012-07-30 SURGICAL SUPPLY — 42 items
ATTRACTOMAT 16X20 MAGNETIC DRP (DRAPES) ×3 IMPLANT
BAG URINE DRAINAGE (UROLOGICAL SUPPLIES) ×1 IMPLANT
BLADE EXTENDED COATED 6.5IN (ELECTRODE) ×3 IMPLANT
CANISTER SUCTION 2500CC (MISCELLANEOUS) ×3 IMPLANT
CHLORAPREP W/TINT 26ML (MISCELLANEOUS) ×2 IMPLANT
CLIP TI MEDIUM LARGE 6 (CLIP) ×6 IMPLANT
CLOTH BEACON ORANGE TIMEOUT ST (SAFETY) ×3 IMPLANT
COVER SURGICAL LIGHT HANDLE (MISCELLANEOUS) ×3 IMPLANT
DRAPE UTILITY 15X26 (DRAPE) ×3 IMPLANT
DRAPE WARM FLUID 44X44 (DRAPE) ×3 IMPLANT
DRSG TELFA 4X14 ISLAND ADH (GAUZE/BANDAGES/DRESSINGS) ×2 IMPLANT
ELECT REM PT RETURN 9FT ADLT (ELECTROSURGICAL) ×3
ELECTRODE REM PT RTRN 9FT ADLT (ELECTROSURGICAL) ×2 IMPLANT
GAUZE SPONGE 4X4 16PLY XRAY LF (GAUZE/BANDAGES/DRESSINGS) ×3 IMPLANT
GLOVE BIO SURGEON STRL SZ 6.5 (GLOVE) ×3 IMPLANT
GLOVE BIO SURGEON STRL SZ7.5 (GLOVE) ×9 IMPLANT
GLOVE BIOGEL M STRL SZ7.5 (GLOVE) ×15 IMPLANT
GOWN PREVENTION PLUS XLARGE (GOWN DISPOSABLE) ×1 IMPLANT
GOWN STRL NON-REIN LRG LVL3 (GOWN DISPOSABLE) ×3 IMPLANT
GOWN STRL REIN XL XLG (GOWN DISPOSABLE) ×2 IMPLANT
NS IRRIG 1000ML POUR BTL (IV SOLUTION) ×4 IMPLANT
PACK ABDOMINAL WL (CUSTOM PROCEDURE TRAY) ×3 IMPLANT
SHEET LAVH (DRAPES) ×3 IMPLANT
SPONGE LAP 18X18 X RAY DECT (DISPOSABLE) ×2 IMPLANT
STAPLER VISISTAT 35W (STAPLE) ×3 IMPLANT
SUT ETHILON 1 LR 30 (SUTURE) IMPLANT
SUT PDS AB 1 CTXB1 36 (SUTURE) ×6 IMPLANT
SUT SILK 2 0 (SUTURE) ×3
SUT SILK 2 0 30  PSL (SUTURE)
SUT SILK 2 0 30 PSL (SUTURE) IMPLANT
SUT SILK 2-0 18XBRD TIE 12 (SUTURE) ×2 IMPLANT
SUT VIC AB 0 CT1 36 (SUTURE) ×3 IMPLANT
SUT VIC AB 2-0 CT2 27 (SUTURE) ×8 IMPLANT
SUT VIC AB 2-0 SH 27 (SUTURE) ×6
SUT VIC AB 2-0 SH 27X BRD (SUTURE) ×8 IMPLANT
SUT VIC AB 3-0 CTX 36 (SUTURE) IMPLANT
SUT VICRYL 2 0 18  UND BR (SUTURE) ×1
SUT VICRYL 2 0 18 UND BR (SUTURE) ×2 IMPLANT
TOWEL OR 17X26 10 PK STRL BLUE (TOWEL DISPOSABLE) ×3 IMPLANT
TOWEL OR NON WOVEN STRL DISP B (DISPOSABLE) ×3 IMPLANT
TRAY FOLEY CATH 14FRSI W/METER (CATHETERS) ×3 IMPLANT
WATER STERILE IRR 1500ML POUR (IV SOLUTION) ×3 IMPLANT

## 2012-07-30 NOTE — Op Note (Signed)
Barbara Lynch  female MEDICAL RECORD ZO:[id number] DATE OF BIRTH: 1961/06/04 PHYSICIAN: De Blanch, M.D  DATE OF PROCEDURE: 07/30/2012   :  OPERATIVE REPORT  PREOPERATIVE DIAGNOSIS: Complex left pelvic mass  POSTOPERATIVE DIAGNOSIS: Left ovarian cystadenoma, extensive pelvic adhesions.  PROCEDURE: Lysis of adhesions, left salpingo-oophorectomy  SURGEON: De Blanch, M.D ASSISTANT: Antionette Char M.D., Telford Nab RN ANESTHESIA: Gen. with oral tracheal tube ESTIMATED BLOOD LOSS: 100 mL  SURGICAL FINDINGS: At the time of exploratory laparotomy the patient was found to have extensive adhesions throughout the pelvis. The sigmoid colon and mesentery was densely adherent to the left ovarian cyst. The cyst itself was smooth-walled. On frozen section was felt to be benign. The right ovary was visualized and appeared normal. The right tube was densely adherent to the sigmoid colon and could not be removed.  PROCEDURE: The patient was brought to the operating room and after satisfactory attainment of general anesthesia was placed in a modified lithotomy position in Montezuma stirrups. The anterior abdominal wall, perineum and vagina were prepped, a Foley catheter was inserted, and the patient was draped. The abdomen was entered through prior low midline incision. Initially pelvic washings were obtained for cytology. A Bookwalter retractor was assembled. The sigmoid colon was retracted laterally and using sharp and blunt dissection the cyst wall was dissected away from the adherent sigmoid mesentery. We opened up the left pelvic sidewall and identified the vessels and ureter. We proceeded with further dissection of the cyst away from the sigmoid colon and pelvic sidewall until we could identify the ovarian vessels. These were skeletonized and clamped, cut free tied and suture-ligated. The dissection proceeded towards the vaginal cuff mobilizing the ovarian cyst away from all  adhesions. At the vaginal cuff angle a clamp was placed and the cyst was excised. Suture of ligature was used to control this pedicle. The pelvis was irrigated and found to be hemostatic. Frozen section returned as a benign ovarian cyst. The patient had previously indicated she wished to preserve her right ovary.  Packs and retractors removed. The anterior abdominal wall was closed in layers. The first layer was a running mass closure using #1 PDS. 20 mL of experil was injected into the subcutaneous tissue. The skin was closed skin staples a dressing was applied. The patient was awakened from anesthesia and taken to the recovery room in satisfactory condition. Sponge needle and isthmic counts correct x2.    De Blanch, M.D

## 2012-07-30 NOTE — H&P (View-Only) (Signed)
Consult Note: Gyn-Onc  Consult was requested by Dr. Tresa Res for the evaluation of Barbara Lynch 51 y.o. female  CC:  Chief Complaint  Patient presents with  . Ovarian Cancer    New consult    HPI: 51 y/o G3P2 LNMP 20o2.  Hysterectomy  2002 for menorrhagia. Patient reports intermittent diarrhea/constipation and post pradial LLQ  pelvic pressure for 3-4 years. Barbara Lynch reports fair appetite, early satiety and abdominal bloating. Reports a 12 pound weight loss in the past 18 months with minor diet modifications. A CT scan of the abdomen and pelvis with contrast was collected on 07/08/2012. It was notable for a left pelvic cystic septated lesion measuring 9.0 x 5.2 x 9.0cm. Some septations appeared thickened. No fluid is noted within the cul-de-sac and scattered rectosigmoid colonic diverticula were present.  CA 125 is 6.3  Current Meds:  No outpatient encounter prescriptions on file as of 07/18/2012.    Allergy:  Allergies  Allergen Reactions  . Flagyl (Metronidazole)     Nausea and sea sickness    Social Hx:   History   Social History  . Marital Status: Married    Spouse Name: N/A    Number of Children: N/A  . Years of Education: N/A   Occupational History  . Not on file.   Social History Main Topics  . Smoking status: Former Smoker    Types: Cigarettes    Quit date: 07/18/1998  . Smokeless tobacco: Not on file  . Alcohol Use: Yes     Comment: Occ  . Drug Use: No  . Sexually Active: Yes   Other Topics Concern  . Not on file   Social History Narrative  . No narrative on file    Past Surgical Hx:  TAH 2002 C/S x 2, Repair of spinal stenosis 2011. CKC 1988 BTL [place] clips 1987  Past Medical Hx: NIDDM 2010    HTN 2010    Bronchitis 05/30/2012  Past Gynecological History:  G3 P2 Menoarche 14, regular menses.  H/o menorrhagia with hysterectomy 2002.  Pap 07/10/2012.   Abnormal pap 1988 tx with CKC.     Family Hx: No family history on file.  Review of  Systems:  Constitutional  Feels well, fair appetite. Skin/Breast  No rash, sores, jaundice, itching, dryness Cardiovascular  No chest pain, shortness of breath.  Pulmonary  No cough or wheeze.  Gastro Intestinal  No nausea, vomitting.  Intermittent diarrhoea. No bright red blood per rectum, Left lower pelvic pain.  Barbara Lynch Urinary  No frequency, urgency, dysuria,no vaginal  Bleeding or discharge Musculo Skeletal  No myalgia, arthralgia, joint swelling or pain.  Left hip pain with radiation through the lateral aspect of the left leg. Neurologic  No weakness, numbness, change in gait,  Psychology  No depression, anxiety, insomnia. Occasional mood swings that lasts a few weeks.  Vitals:  Blood pressure 136/80, pulse 80, temperature 98 F (36.7 C), temperature source Oral, resp. rate 16, height 5\' 4"  (1.626 m), weight 207 lb (93.895 kg).  Physical Exam: WD in NAD Neck  Supple NROM, without any enlargements.  Lymph Node Survey No cervical supraclavicular or inguinal adenopathy Cardiovascular  Pulse normal rate, regularity and rhythm. S1 and S2 normal.  Lungs  Clear to auscultation bilateraly, without wheezes/crackles/rhonchi. Good air movement.  Skin  No rash/lesions/breakdown  Psychiatry  Alert and oriented to person, place, and time  Abdomen  Normoactive bowel sounds, abdomen soft, non-tender and obese. Multiple vertical incisions without evidence  of hernia.  Back No CVA tenderness Genito Urinary  Vulva/vagina: Normal external female genitalia.  No lesions. No discharge or bleeding.  Bladder/urethra:  No lesions or masses  Vagina: Well estrogenized. No palpable masses.   Adnexa: No palpable masses. No cul de sac nodularity Rectal  Good tone, no masses no cul de sac nodularity.  Extremities  No bilateral cyanosis, clubbing or edema.   Assessment/Plan:  Barbara Lynch  is a 51 y.o.  year old with a 9 x 9 x 5.2 cm left pelvic mass. The patient reports years of  left lower quadrant discomfort however her most troublesome complaint is that of the left hip pain with radiation to the knee.  The recommendation presented to Barbara Lynch  was exploratory laparotomy with left salpingo-oophorectomy right salpingectomy, and removal  of the contralateral ovary only as indicated. In the event that malignancy is encountered the procedure will be inclusive of omentectomy lymph node dissection debulking and other indicated procedures. The risks and benefits of the procedure discussed with the patient and her husband were that of infection bleeding damage to surrounding structures prolonged hospitalization and reoperation. The patient and her husband are aware that procedure will be completed by Dr. De Blanch and Dr. Tamela Oddi on July 30, 2012.  The patient was strongly counseled that a left salpingo-oophorectomy would likely not alleviate the left hip pain with radiation to the knee.  Questions were answered to their satisfaction.    Laurette Schimke, MD, PhD 07/18/2012, 12:01 PM

## 2012-07-30 NOTE — Anesthesia Preprocedure Evaluation (Addendum)
Anesthesia Evaluation  Patient identified by MRN, date of birth, ID band Patient awake    Reviewed: Allergy & Precautions, H&P , NPO status , Patient's Chart, lab work & pertinent test results  Airway Mallampati: II TM Distance: >3 FB Neck ROM: Full    Dental No notable dental hx.    Pulmonary neg pulmonary ROS,  breath sounds clear to auscultation  Pulmonary exam normal       Cardiovascular hypertension, - anginanegative cardio ROS  Rhythm:Regular Rate:Normal     Neuro/Psych negative neurological ROS  negative psych ROS   GI/Hepatic negative GI ROS, Neg liver ROS,   Endo/Other  negative endocrine ROSdiabetes, Type 2, Oral Hypoglycemic Agents  Renal/GU negative Renal ROS  negative genitourinary   Musculoskeletal negative musculoskeletal ROS (+)   Abdominal   Peds negative pediatric ROS (+)  Hematology negative hematology ROS (+)   Anesthesia Other Findings Upper front cap   Reproductive/Obstetrics negative OB ROS                          Anesthesia Physical Anesthesia Plan  ASA: II  Anesthesia Plan: General   Post-op Pain Management:    Induction: Intravenous  Airway Management Planned: Oral ETT  Additional Equipment:   Intra-op Plan:   Post-operative Plan: Extubation in OR  Informed Consent: I have reviewed the patients History and Physical, chart, labs and discussed the procedure including the risks, benefits and alternatives for the proposed anesthesia with the patient or authorized representative who has indicated his/her understanding and acceptance.   Dental advisory given  Plan Discussed with: CRNA  Anesthesia Plan Comments:         Anesthesia Quick Evaluation

## 2012-07-30 NOTE — Transfer of Care (Signed)
Immediate Anesthesia Transfer of Care Note  Patient: Barbara Lynch  Procedure(s) Performed: Procedure(s) (LRB) with comments: SALPINGO OOPHORECTOMY (Left) -    Patient Location: PACU  Anesthesia Type:General  Level of Consciousness: awake, alert  and patient cooperative  Airway & Oxygen Therapy: Patient Spontanous Breathing and Patient connected to face mask oxygen  Post-op Assessment: Report given to PACU RN and Post -op Vital signs reviewed and stable  Post vital signs: Reviewed and stable  Complications: No apparent anesthesia complications

## 2012-07-30 NOTE — Anesthesia Postprocedure Evaluation (Signed)
  Anesthesia Post-op Note  Patient: Barbara Lynch  Procedure(s) Performed: Procedure(s) (LRB): SALPINGO OOPHORECTOMY (Left)  Patient Location: PACU  Anesthesia Type: General  Level of Consciousness: awake and alert   Airway and Oxygen Therapy: Patient Spontanous Breathing  Post-op Pain: mild  Post-op Assessment: Post-op Vital signs reviewed, Patient's Cardiovascular Status Stable, Respiratory Function Stable, Patent Airway and No signs of Nausea or vomiting  Last Vitals:  Filed Vitals:   07/30/12 1445  BP: 111/66  Pulse: 63  Temp: 36.7 C  Resp: 12    Post-op Vital Signs: stable   Complications: No apparent anesthesia complications

## 2012-07-30 NOTE — Interval H&P Note (Signed)
History and Physical Interval Note:  07/30/2012 9:33 AM  Barbara Lynch  has presented today for surgery, with the diagnosis of PELVIC MASS  The various methods of treatment have been discussed with the patient and family. After consideration of risks, benefits and other options for treatment, the patient has consented to  Procedure(s) (LRB) with comments: HYSTERECTOMY ABDOMINAL (Bilateral) - TOTAL ABDOMINAL HYSTECTOMY LEFT SALPINGOOPHORECTOMY, RIGHT SALPINGECTOMY,  POSSIBLE TOTAL ABDOMINAL HYSTERECTOMY  SALPINGO OOPHORECTOMY (Bilateral) -  BILATERAL SALPINGOOPHORECTOMY,  STAGING   as a surgical intervention .  The patient's history has been reviewed, patient examined, no change in status, stable for surgery.  I have reviewed the patient's chart and labs.  Questions were answered to the patient's satisfaction.     CLARKE-PEARSON,Jashun Puertas L

## 2012-07-30 NOTE — Interval H&P Note (Signed)
History and Physical Interval Note:  07/30/2012 11:23 AM  Barbara Lynch  has presented today for surgery, with the diagnosis of PELVIC MASS  The various methods of treatment have been discussed with the patient and family. After consideration of risks, benefits and other options for treatment, the patient has consented to  Procedure(s) (LRB) with comments: HYSTERECTOMY ABDOMINAL (Bilateral) - TOTAL ABDOMINAL HYSTECTOMY LEFT SALPINGOOPHORECTOMY, RIGHT SALPINGECTOMY,  POSSIBLE TOTAL ABDOMINAL HYSTERECTOMY  SALPINGO OOPHORECTOMY (Bilateral) -  BILATERAL SALPINGOOPHORECTOMY,  STAGING   as a surgical intervention .  The patient's history has been reviewed, patient examined, no change in status, stable for surgery.  I have reviewed the patient's chart and labs.  Questions were answered to the patient's satisfaction.     CLARKE-PEARSON,Mellisa Arshad L

## 2012-07-31 ENCOUNTER — Encounter (HOSPITAL_COMMUNITY): Payer: Self-pay | Admitting: Gynecology

## 2012-07-31 LAB — GLUCOSE, CAPILLARY
Glucose-Capillary: 126 mg/dL — ABNORMAL HIGH (ref 70–99)
Glucose-Capillary: 147 mg/dL — ABNORMAL HIGH (ref 70–99)

## 2012-07-31 LAB — CBC
HCT: 34.9 % — ABNORMAL LOW (ref 36.0–46.0)
Hemoglobin: 12.5 g/dL (ref 12.0–15.0)
MCH: 31.9 pg (ref 26.0–34.0)
MCHC: 35.8 g/dL (ref 30.0–36.0)
RBC: 3.92 MIL/uL (ref 3.87–5.11)

## 2012-07-31 LAB — BASIC METABOLIC PANEL
BUN: 8 mg/dL (ref 6–23)
CO2: 25 mEq/L (ref 19–32)
Chloride: 98 mEq/L (ref 96–112)
Glucose, Bld: 178 mg/dL — ABNORMAL HIGH (ref 70–99)
Potassium: 3.9 mEq/L (ref 3.5–5.1)
Sodium: 132 mEq/L — ABNORMAL LOW (ref 135–145)

## 2012-07-31 MED ORDER — INSULIN ASPART 100 UNIT/ML ~~LOC~~ SOLN
0.0000 [IU] | Freq: Three times a day (TID) | SUBCUTANEOUS | Status: DC
  Administered 2012-07-31 – 2012-08-01 (×3): 2 [IU] via SUBCUTANEOUS

## 2012-07-31 MED ORDER — INSULIN GLARGINE 100 UNIT/ML ~~LOC~~ SOLN
5.0000 [IU] | Freq: Once | SUBCUTANEOUS | Status: AC
  Administered 2012-07-31: 5 [IU] via SUBCUTANEOUS

## 2012-07-31 MED ORDER — INSULIN ASPART 100 UNIT/ML ~~LOC~~ SOLN
0.0000 [IU] | SUBCUTANEOUS | Status: DC
  Administered 2012-07-31: 3 [IU] via SUBCUTANEOUS

## 2012-07-31 MED ORDER — IBUPROFEN 600 MG PO TABS
600.0000 mg | ORAL_TABLET | Freq: Four times a day (QID) | ORAL | Status: DC
  Administered 2012-07-31 – 2012-08-01 (×4): 600 mg via ORAL
  Filled 2012-07-31 (×7): qty 1

## 2012-07-31 MED ORDER — LORAZEPAM 0.5 MG PO TABS
0.5000 mg | ORAL_TABLET | Freq: Once | ORAL | Status: AC
  Administered 2012-07-31: 0.5 mg via ORAL
  Filled 2012-07-31: qty 1

## 2012-07-31 MED ORDER — PNEUMOCOCCAL VAC POLYVALENT 25 MCG/0.5ML IJ INJ
0.5000 mL | INJECTION | INTRAMUSCULAR | Status: AC
  Administered 2012-08-01: 0.5 mL via INTRAMUSCULAR
  Filled 2012-07-31: qty 0.5

## 2012-07-31 MED ORDER — METFORMIN HCL 500 MG PO TABS
500.0000 mg | ORAL_TABLET | Freq: Every day | ORAL | Status: DC
  Administered 2012-07-31: 500 mg via ORAL
  Filled 2012-07-31 (×2): qty 1

## 2012-07-31 NOTE — Progress Notes (Signed)
Spoke to Dr. Tamela Oddi informed her that patient states she is in pain and cannot wait 4 hours between pain medication, also only able to give patient one percocet due to will be over tylenol limit. Orders given.

## 2012-07-31 NOTE — Progress Notes (Signed)
Patient called for pain meds, informed that pain meds are not due for another hour states this is fine, offered to bring two percocets when it was time patient states no she would rather take only one tablet at a time and then take another tablet several hours later.

## 2012-07-31 NOTE — Care Management Note (Signed)
    Page 1 of 1   07/31/2012     11:29:38 AM   CARE MANAGEMENT NOTE 07/31/2012  Patient:  Barbara Lynch, Barbara Lynch   Account Number:  [id number]  Date Initiated:  07/31/2012  Documentation initiated by:  Lorenda Ishihara  Subjective/Objective Assessment:   50 yo female admitted s/p open lysis of adhesions, oopherectomy     Action/Plan:   Home when stable   Anticipated DC Date:  08/01/2012   Anticipated DC Plan:  HOME/SELF CARE      DC Planning Services  CM consult      Choice offered to / List presented to:             Status of service:  Completed, signed off Medicare Important Message given?  NA - LOS <3 / Initial given by admissions (If response is "NO", the following Medicare IM given date fields will be blank) Date Medicare IM given:   Date Additional Medicare IM given:    Discharge Disposition:  HOME/SELF CARE  Per UR Regulation:  Reviewed for med. necessity/level of care/duration of stay  If discussed at Long Length of Stay Meetings, dates discussed:    Comments:

## 2012-07-31 NOTE — Progress Notes (Signed)
Inpatient Diabetes Program Recommendations  AACE/ADA: New Consensus Statement on Inpatient Glycemic Control  Target Ranges:  Prepandial:   less than 140 mg/dL      Peak postprandial:   less than 180 mg/dL (1-2 hours)      Critically ill patients:  140 - 180 mg/dL  Pager:  [phone] Hours:  8 am-10pm   Reason for Visit: No record of A1C on file.  Inpatient Diabetes Program Recommendations HgbA1C: Check HgbA1C  Alfredia Client PhD, RN, BC-ADM Diabetes Coordinator  Office:  [phone] Team Pager:  [phone]

## 2012-07-31 NOTE — Progress Notes (Signed)
Spoke with patient informed of new orders, states ok, and that she is feeling some better now, remains irritable.

## 2012-07-31 NOTE — Progress Notes (Signed)
1 Day Post-Op Procedure(s) (LRB): SALPINGO OOPHORECTOMY (Left)  Subjective: Patient reports tolerating full liquid diet this am.  Reporting adequate pain relief with PCA use.  Denies nausea, vomiting, passing flatus, or having a bowel movement at this time.   Objective: Vital signs in last 24 hours: Temp:  [97.4 F (36.3 C)-98.2 F (36.8 C)] 97.9 F (36.6 C) (12/04 0612) Pulse Rate:  [62-78] 65  (12/04 0612) Resp:  [9-20] 16  (12/04 0612) BP: (108-130)/(45-89) 125/73 mmHg (12/04 0612) SpO2:  [98 %-100 %] 100 % (12/04 0612) Weight:  [204 lb 12.2 oz (92.88 kg)] 204 lb 12.2 oz (92.88 kg) (12/03 1522) Last BM Date: 07/30/12  Intake/Output from previous day: 12/03 0701 - 12/04 0700 In: 4733.3 [P.O.:600; I.V.:3933.3; IV Piggyback:200] Out: 1500 [Urine:1450; Blood:50]  Physical Examination: General: alert, cooperative and no distress Resp: clear to auscultation bilaterally Cardio: regular rate and rhythm, S1, S2 normal, no murmur, click, rub or gallop GI: soft, non-tender; bowel sounds normal; no masses,  no organomegaly, incision: dressing removed from midline incision with staples, minimal sanguinous drainage noted, incision cleansed and open to air and abdomen obese Extremities: extremities normal, atraumatic, no cyanosis or edema  Labs: WBC/Hgb/Hct/Plts:  16.3/12.5/34.9/199 (12/04 0505) BUN/Cr/glu/ALT/AST/amyl/lip:  8/0.65/--/--/--/--/-- (12/04 0505)  Assessment: 51 y.o. s/p Procedure(s): SALPINGO OOPHORECTOMY: stable Pain:  Pain is well-controlled on PCA.  CV: Hypertension: Stable at this time.  Current treatment:  lisinopril (Prinivil).  GI:  Tolerating po: Yes.     Endo: Diabetes mellitus Type II, under fair control..  CBG:  CBG (last 3)   Basename 07/31/12 0814 07/30/12 2116 07/30/12 1604  GLUCAP 184* 205* 205*    Prophylaxis: intermittent pneumatic compression boots.  Plan: Advance diet Encourage ambulation Advance to PO medication Discontinue IV  fluids Saline lock IV Encourage IS use, deep breathing, and coughing   LOS: 1 day    CROSS, MELISSA DEAL 07/31/2012, 9:31 AM

## 2012-07-31 NOTE — Progress Notes (Signed)
Paged Barbara Mccreedy, NP patient upset and cursing at staff due to percocet being 4 hours apart and states this is not enough and she cannot wait this long.

## 2012-07-31 NOTE — Progress Notes (Signed)
Patient calm and pleasant at this time. States pain much improved and will try ibprofen. kpad ordered from portable.

## 2012-08-01 LAB — GLUCOSE, CAPILLARY: Glucose-Capillary: 141 mg/dL — ABNORMAL HIGH (ref 70–99)

## 2012-08-01 MED ORDER — BISACODYL 10 MG RE SUPP
10.0000 mg | Freq: Once | RECTAL | Status: AC
  Administered 2012-08-01: 10 mg via RECTAL
  Filled 2012-08-01: qty 1

## 2012-08-01 MED ORDER — OXYCODONE-ACETAMINOPHEN 5-325 MG PO TABS: 1.0000 | ORAL_TABLET | ORAL | Status: DC | PRN

## 2012-08-01 NOTE — Progress Notes (Signed)
Pt for d/c to home today. No IV noted. No dressing to incision. Staples CDI to midline of the abdomen. D/C instructions & RX given with verbalized understanding. Pt had a medium loose BM after Prune juice & Dulcolax supp. Able to ambulate 2x on hallway. Husband at bedside to assist with d/c.

## 2012-08-01 NOTE — Progress Notes (Signed)
Spoke with patient regarding pneumonia vaccine patient stated she would like one. Put in order per protocol. Zyaire Mccleod RN

## 2012-08-01 NOTE — Addendum Note (Signed)
Addendum  created 08/01/12 1726 by Valeda Malm, CRNA   Modules edited:Anesthesia Events

## 2012-08-01 NOTE — Discharge Summary (Signed)
Physician Discharge Summary  Patient ID: Barbara Lynch MRN: [id number] DOB/AGE: 02/07/1961 51 y.o.  Admit date: 07/30/2012 Discharge date: 08/01/2012  Admission Diagnoses: Pelvic mass  Discharge Diagnoses:  Principal Problem:  *Pelvic mass  Discharged Condition:  The patient is good condition and stable for discharge.    Hospital Course: On 07/30/2012, the patient underwent the following: Procedure(s):  SALPINGO OOPHORECTOMY.  The postoperative course was uneventful.  She was discharged to home on postoperative day 2 tolerating a regular diet.  Consults: None  Significant Diagnostic Studies: None  Treatments: surgery: See above  Discharge Exam: Blood pressure 127/75, pulse 90, temperature 97.8 F (36.6 C), temperature source Oral, resp. rate 18, height 5\' 4"  (1.626 m), weight 204 lb 12.2 oz (92.88 kg), SpO2 93.00%. General appearance: alert, cooperative and no distress Resp: clear to auscultation bilaterally Cardio: regular rate and rhythm, S1, S2 normal, no murmur, click, rub or gallop GI: soft, non-tender; bowel sounds normal; no masses,  no organomegaly Extremities: extremities normal, atraumatic, no cyanosis or edema Incision/Wound: midline incision with staples open to air, incision clean, dry, and intact  Disposition: 01-Home or Self Care  Discharge Orders    Future Orders Please Complete By Expires   Diet - low sodium heart healthy      Increase activity slowly      Driving Restrictions      Comments:   No driving for 1-2 weeks.  Do not take narcotics and drive.   Lifting restrictions      Comments:   No lifting greater than 10 lbs.   Sexual Activity Restrictions      Comments:   No sexual activity, nothing in the vagina, for 6 weeks.   Call MD for:  temperature >100.4      Call MD for:  persistant nausea and vomiting      Call MD for:  severe uncontrolled pain      Call MD for:  redness, tenderness, or signs of infection (pain, swelling, redness, odor or  green/yellow discharge around incision site)      Call MD for:  difficulty breathing, headache or visual disturbances      Call MD for:  hives      Call MD for:  persistant dizziness or light-headedness      Call MD for:  extreme fatigue          Medication List     As of 08/01/2012  9:14 AM    TAKE these medications         ibuprofen 200 MG tablet   Commonly known as: ADVIL,MOTRIN   Take 600 mg by mouth every 6 (six) hours as needed. Pain      lisinopril 20 MG tablet   Commonly known as: PRINIVIL,ZESTRIL   Take 20 mg by mouth at bedtime.      metFORMIN 500 MG tablet   Commonly known as: GLUCOPHAGE   Take 500 mg by mouth at bedtime.      MIRALAX PO   Take 17 g by mouth daily.      oxyCODONE-acetaminophen 5-325 MG per tablet   Commonly known as: PERCOCET/ROXICET   Take 1-2 tablets by mouth every 4 (four) hours as needed (moderate to severe pain).      simvastatin 20 MG tablet   Commonly known as: ZOCOR   Take 20 mg by mouth every evening.           Follow-up Information    On 08/07/2012 to follow up. (at 11:00am  Gynecologic Oncology office at the [facility] for staple removal.)          Signed: Gretna Bergin DEAL 08/01/2012, 9:14 AM

## 2012-08-02 ENCOUNTER — Telehealth: Payer: Self-pay | Admitting: *Deleted

## 2012-08-02 NOTE — Telephone Encounter (Signed)
Patient notified of Path results.  Asked her to call for F/U appt.

## 2012-08-29 ENCOUNTER — Encounter: Payer: Self-pay | Admitting: Gynecologic Oncology

## 2012-08-29 ENCOUNTER — Ambulatory Visit: Payer: 59 | Attending: Gynecologic Oncology | Admitting: Gynecologic Oncology

## 2012-08-29 VITALS — BP 122/82 | HR 80 | Temp 98.2°F | Resp 18 | Ht 64.0 in | Wt 191.0 lb

## 2012-08-29 DIAGNOSIS — E119 Type 2 diabetes mellitus without complications: Secondary | ICD-10-CM | POA: Insufficient documentation

## 2012-08-29 DIAGNOSIS — Z09 Encounter for follow-up examination after completed treatment for conditions other than malignant neoplasm: Secondary | ICD-10-CM | POA: Insufficient documentation

## 2012-08-29 DIAGNOSIS — R1900 Intra-abdominal and pelvic swelling, mass and lump, unspecified site: Secondary | ICD-10-CM

## 2012-08-29 DIAGNOSIS — F411 Generalized anxiety disorder: Secondary | ICD-10-CM | POA: Insufficient documentation

## 2012-08-29 DIAGNOSIS — Z87891 Personal history of nicotine dependence: Secondary | ICD-10-CM | POA: Insufficient documentation

## 2012-08-29 DIAGNOSIS — R1909 Other intra-abdominal and pelvic swelling, mass and lump: Secondary | ICD-10-CM | POA: Insufficient documentation

## 2012-08-29 DIAGNOSIS — Z90710 Acquired absence of both cervix and uterus: Secondary | ICD-10-CM | POA: Insufficient documentation

## 2012-08-29 DIAGNOSIS — Z9079 Acquired absence of other genital organ(s): Secondary | ICD-10-CM | POA: Insufficient documentation

## 2012-08-29 DIAGNOSIS — I10 Essential (primary) hypertension: Secondary | ICD-10-CM | POA: Insufficient documentation

## 2012-08-29 NOTE — Patient Instructions (Addendum)
Please return to Dr. Tresa Res for routine gynecologic care and GYN Oncology as needed if problems arise in the future.  Follow up with your primary care physician about persistent left hip pain and about starting black cohosh for hot flashes to evaluate for any potential medication interactions.  Thank you for coming to see me today.  I appreciate your confidence in choosing [facility] for your medical care.  If you have any questions about your visit today, please call our office and we will get back to you as soon as possible.  Warner Mccreedy, NP Gynecologic Oncology

## 2012-08-29 NOTE — Progress Notes (Signed)
Follow Up Note: Gyn-Onc  Barbara Lynch 52 y.o. female  CC:  Chief Complaint  Patient presents with  . Pelvic Mass    Post-operative follow up    HPI:  Barbara Lynch is a 52 year old female referred by Dr. Tresa Res for a pelvic mass.  A CT scan of the abdomen and pelvis with contrast was collected on 07/08/2012, which revealed a left pelvic cystic septated lesion measuring 9.0 x 5.2 x 9.0cm with some septations appearing thickened.  CA 125 was 6.3.  She underwent a lysis of adhesions and left salpingo-oophorectomy on 07/30/2012.  Final pathology revealed a benign cystically dilated fallopian tube and benign ovary with hemorrhagic corpus luteal-type cyst with no atypia or malignancy.  Post-operative course was uneventful.   Interval History:  Barbara Lynch presents for post-operative follow up today.  Patient describes expected post operative status.  Adequate PO intake reported.  Bowels and bladder functioning without difficulty.  Pain minimal.  She denies vaginal or rectal bleeding.  She reports frequent hot flashes since surgery.  Reporting that she wakes up in the middle of the night with her night gown saturated with sweat.  She does not want to try synthetic hormones and reports that she has been thinking about trying black cohosh.  She continues to report intermittent left hip pain that was present before surgery.  She reports that the pain radiates into her left thigh and lower back with "flare-ups" more frequently with moderate amounts of ambulation.  She reports recently recovering from "an infection that my doctor could never find the cause of."  Reporting having a fever and severe headache at that time that resolved after completing a course of penicillin.        Review of Systems  Constitutional: Feels well.  Cardiovascular: No chest pain, shortness of breath, or edema.  Pulmonary: No cough or wheeze.  Gastrointestinal: No nausea, vomiting, or diarrhea. No bright red blood per rectum or  change in bowel movement.  Genitourinary: No frequency, urgency, or dysuria. No vaginal bleeding or discharge.  Musculoskeletal: No myalgia.  Intermittent left hip pain with radiation into the thigh. Neurologic: No weakness, numbness, or change in gait.  Psychology: No depression, anxiety, or insomnia.  Current Meds:  Outpatient Encounter Prescriptions as of 08/29/2012  Medication Sig Dispense Refill  . lisinopril (PRINIVIL,ZESTRIL) 20 MG tablet Take 20 mg by mouth at bedtime.       . metFORMIN (GLUCOPHAGE) 500 MG tablet Take 500 mg by mouth at bedtime.       . Polyethylene Glycol 3350 (MIRALAX PO) Take 17 g by mouth daily.       . simvastatin (ZOCOR) 20 MG tablet Take 20 mg by mouth every evening.      Marland Kitchen ibuprofen (ADVIL,MOTRIN) 200 MG tablet Take 600 mg by mouth every 6 (six) hours as needed. Pain      . oxyCODONE-acetaminophen (PERCOCET/ROXICET) 5-325 MG per tablet Take 1-2 tablets by mouth every 4 (four) hours as needed (moderate to severe pain).  60 tablet  0    Allergy:  Allergies  Allergen Reactions  . Flagyl (Metronidazole)     Nausea and sea sickness    Social Hx:   History   Social History  . Marital Status: Married    Spouse Name: N/A    Number of Children: N/A  . Years of Education: N/A   Occupational History  . Not on file.   Social History Main Topics  . Smoking status: Former Smoker  Types: Cigarettes    Quit date: 07/18/1998  . Smokeless tobacco: Never Used  . Alcohol Use: Yes     Comment: OccAsional   . Drug Use: No  . Sexually Active: Yes   Other Topics Concern  . Not on file   Social History Narrative  . No narrative on file    Past Surgical Hx:  Past Surgical History  Procedure Date  . Anterior cervical decomp/discectomy fusion   . Cesarean section     x2  . Tubal ligation     Hukal clips   . Abdominal hysterectomy     abdominal hysterectomy   . Salpingoophorectomy 07/30/2012    Procedure: SALPINGO OOPHORECTOMY;  Surgeon: Jeannette Corpus, MD;  Location: [facility];  Service: Gynecology;  Laterality: Left;       Past Medical Hx:  Past Medical History  Diagnosis Date  . Anxiety   . Anginal pain     " panic attack" and chest tight- 1 episode   . Bronchitis   . Nasal polyp     right nostril   . Deviated septum   . Hypertension   . Diabetes mellitus without complication   . Left hip pain     also in left leg- ? etiology   . Urinary tract infection     hx of   . Arthritis     pinched nerve in neck     Family Hx: History reviewed. No pertinent family history.  Vitals:  Blood pressure 122/82, pulse 80, temperature 98.2 F (36.8 C), temperature source Oral, resp. rate 18, height 5\' 4"  (1.626 m), weight 191 lb (86.637 kg).  Physical Exam:  General: Well developed, well nourished female in no acute distress. Alert and oriented x 3.  Neck: Supple without any enlargements.  Lymph node survey: No cervical, supraclavicular, or inguinal adenopathy  Cardiovascular: Regular rate and rhythm. S1 and S2 normal.  Lungs: Clear to auscultation bilaterally. No wheezes/crackles/rhonchi noted.  Skin: No rashes or lesions present. Back: No CVA tenderness  Abdomen: Abdomen soft, non-tender and obese. Active bowel sounds in all quadrants. No evidence of a fluid wave or abdominal masses.  Midline incision well-healed with no evidence of herniation.  Extremities: No bilateral cyanosis or clubbing.   Assessment/Plan:  Barbara Lynch is a 52 year old who underwent a lysis of adhesions and left salpingo-oophorectomy on 07/30/2012.  Final pathology revealed a benign cystically dilated fallopian tube and benign ovary with hemorrhagic corpus luteal-type cyst with no atypia or malignancy.  She is to return to Dr. Tresa Res for routine gynecologic care and GYN Oncology as needed if problems arise in the future.  It is recommended that she follow up with her primary care physician about persistent left hip pain and about starting black cohosh  for hot flashes to evaluate for any potential medication interactions.  Lucianne Smestad DEAL, NP 08/29/2012, 10:21 AM

## 2013-07-14 ENCOUNTER — Encounter: Payer: Self-pay | Admitting: Obstetrics & Gynecology

## 2013-07-15 ENCOUNTER — Ambulatory Visit: Payer: Self-pay | Admitting: Obstetrics and Gynecology

## 2013-07-15 ENCOUNTER — Ambulatory Visit: Payer: Self-pay | Admitting: Obstetrics & Gynecology

## 2013-07-15 ENCOUNTER — Telehealth: Payer: Self-pay | Admitting: Obstetrics & Gynecology

## 2013-07-15 NOTE — Telephone Encounter (Signed)
That is fine.  Do not bill no show fee please.

## 2013-07-15 NOTE — Telephone Encounter (Signed)
Patient called about appt at 2 she called at 1:18 and left a message saying she could not make the appt due to her work. She will call back to reschedule her appt.

## 2014-06-08 ENCOUNTER — Encounter (HOSPITAL_COMMUNITY): Payer: Self-pay | Admitting: Emergency Medicine

## 2014-06-08 ENCOUNTER — Emergency Department (INDEPENDENT_AMBULATORY_CARE_PROVIDER_SITE_OTHER)
Admission: EM | Admit: 2014-06-08 | Discharge: 2014-06-08 | Disposition: A | Discharge status: Home / Self Care | Payer: 59 | Source: Home / Self Care | Attending: Emergency Medicine | Admitting: Emergency Medicine

## 2014-06-08 ENCOUNTER — Ambulatory Visit (HOSPITAL_COMMUNITY): Payer: 59 | Attending: Diagnostic Radiology

## 2014-06-08 DIAGNOSIS — I10 Essential (primary) hypertension: Secondary | ICD-10-CM | POA: Diagnosis not present

## 2014-06-08 DIAGNOSIS — R05 Cough: Secondary | ICD-10-CM | POA: Diagnosis not present

## 2014-06-08 DIAGNOSIS — J988 Other specified respiratory disorders: Secondary | ICD-10-CM | POA: Diagnosis not present

## 2014-06-08 DIAGNOSIS — E119 Type 2 diabetes mellitus without complications: Secondary | ICD-10-CM | POA: Insufficient documentation

## 2014-06-08 DIAGNOSIS — M47814 Spondylosis without myelopathy or radiculopathy, thoracic region: Secondary | ICD-10-CM | POA: Diagnosis not present

## 2014-06-08 DIAGNOSIS — M549 Dorsalgia, unspecified: Secondary | ICD-10-CM | POA: Diagnosis present

## 2014-06-08 DIAGNOSIS — R0789 Other chest pain: Secondary | ICD-10-CM

## 2014-06-08 MED ORDER — OXYCODONE-ACETAMINOPHEN 5-325 MG PO TABS: ORAL_TABLET | ORAL | Status: DC

## 2014-06-08 MED ORDER — CYCLOBENZAPRINE HCL 5 MG PO TABS: 5.0000 mg | ORAL_TABLET | Freq: Three times a day (TID) | ORAL | Status: DC | PRN

## 2014-06-08 NOTE — ED Provider Notes (Signed)
Chief Complaint   Back Pain   History of Present Illness    Barbara Lynch is a 53 year old female who had an upper respiratory infection last week with a severe, paroxysmal cough and this is not getting better, but after coughing very hard she has noted pain in her mid, right upper back just adjacent to the medial border of the scapula. It hurts to walk, to move, to sleep on either side, to take a deep breath, or to wear a tight bra. She denies any fever, chills, wheezing, shortness of breath, or hemoptysis and she's had no bowel pain or lower back pain.  Review of Systems    Other than noted above, the patient denies any of the following symptoms. Systemic:  No fever or chills. Pulmonary:  No cough, wheezing, shortness of breath, sputum production, hemoptysis. Cardiac:  No palpitations, rapid heartbeat, dizziness, presyncope or syncope. GI:  No abdominal pain, heartburn, nausea, or vomiting. Ext:  No leg pain or swelling.  [place]    Past medical history, family history, social history, meds, and allergies were reviewed. She has diabetes, hypertension, hypercholesterolemia. She takes metformin, lisinopril, and simvastatin. She's allergic to nonsteroidal anti-inflammatory drugs and Flagyl.  Physical Exam     Vital signs:  BP 145/86  Pulse 86  Temp(Src) 97.3 F (36.3 C) (Oral)  Resp 16  SpO2 96%  LMP 08/28/2004 Gen:  Alert, oriented, in no distress, skin warm and dry. ENT:  Mucous membranes moist, pharynx clear. Neck:  Supple, no adenopathy or tenderness.  No JVD. Lungs:  Clear to auscultation, no wheezes, rales or rhonchi.  No respiratory distress. Heart:  Regular rhythm.  No gallops, murmers, clicks or rubs. Chest:  She has tenderness to palpation locally over the right upper back, just adjacent to the medial border of the scapula. Abdomen:  Soft, nontender, no organomegaly or mass.  Bowel sounds normal.  No pulsatile abdominal mass or bruit. Ext:  No edema.  No calf  tenderness and Homann's sign negative.  Pulses full and equal. Skin:  Warm and dry.  No rash.   Radiology     Dg Ribs Unilateral W/chest Right  06/08/2014   CLINICAL DATA:  Back pain of [place] respiratory infection. Cough. Hypertension. Diabetes.  EXAM: RIGHT RIBS AND CHEST - 3+ VIEW  COMPARISON:  07/24/2012  FINDINGS: Lower cervical plate and screw fixator. Cardiac and mediastinal margins appear normal. Thoracic spondylosis. No rib fracture observed. No pneumothorax or pleural effusion. The lungs appear clear.  Suspected calcific tendinopathy of the rotator cuff bilaterally.  IMPRESSION: 1. No rib fracture identified. Please note that nondisplaced rib fractures can be occult on conventional radiography. 2. Thoracic spondylosis. 3. Suspected rotator cuff calcific tendinopathy bilaterally.   Electronically Signed   By: Sherryl Barters M.D.   On: 06/08/2014 17:35   I reviewed the images independently and personally and concur with the radiologist's findings.  Assessment     The encounter diagnosis was Musculoskeletal chest pain.  Differential diagnosis is occult rib fracture versus muscle strain.  Plan     1.  Meds:  The following meds were prescribed:   Discharge Medication List as of 06/08/2014  5:54 PM    START taking these medications   Details  cyclobenzaprine (FLEXERIL) 5 MG tablet Take 1 tablet (5 mg total) by mouth 3 (three) times daily as needed for muscle spasms., Starting 06/08/2014, Until Discontinued, Normal    !! oxyCODONE-acetaminophen (PERCOCET) 5-325 MG per tablet 1 to 2 tablets every 6 hours as needed for pain., Print     !! - Potential duplicate medications found. Please discuss with provider.      2.  Patient Education/Counseling:  The patient was given appropriate handouts, self care instructions, and instructed in symptomatic relief.     3.  Follow up:  The patient was told to follow up here if no better in 3 to 4 days, or sooner if becoming worse in any way, and give an an some red flag symptoms such as worsening pain, shortness of breath, dizziness, or passing out which would prompt immediate return.      Harden Mo, MD 06/08/14 [phone]

## 2014-06-08 NOTE — Discharge Instructions (Signed)

## 2014-06-08 NOTE — ED Notes (Signed)
Patient transported to X-ray at [facility]. by shuttle with [facility] staff driving.  Xray notified and told whom to call when pt. is ready to be picked up.

## 2014-06-08 NOTE — ED Notes (Signed)
Returned from xray

## 2014-06-08 NOTE — ED Notes (Signed)
C/o back pain onset Sat. afternoon.  No known injury.  Had URI and saw Dr. Moreen Fowler at [place] on 9/25. States she got Augmentin and finished it.  Was having constant coughing and the cough is almost gone.  Took Ibuprofen 800 mg. without relief.  Back hurts with any movement or cough. Pain is sharp.

## 2014-06-29 ENCOUNTER — Encounter (HOSPITAL_COMMUNITY): Payer: Self-pay | Admitting: Emergency Medicine

## 2015-01-26 ENCOUNTER — Ambulatory Visit (INDEPENDENT_AMBULATORY_CARE_PROVIDER_SITE_OTHER): Payer: 59 | Admitting: Endocrinology

## 2015-01-26 ENCOUNTER — Telehealth: Payer: Self-pay | Admitting: Endocrinology

## 2015-01-26 VITALS — BP 136/86 | HR 88 | Temp 98.2°F | Ht 64.0 in | Wt 193.0 lb

## 2015-01-26 DIAGNOSIS — E1121 Type 2 diabetes mellitus with diabetic nephropathy: Secondary | ICD-10-CM

## 2015-01-26 DIAGNOSIS — E119 Type 2 diabetes mellitus without complications: Secondary | ICD-10-CM | POA: Insufficient documentation

## 2015-01-26 MED ORDER — CANAGLIFLOZIN 300 MG PO TABS: 300.0000 mg | ORAL_TABLET | Freq: Every day | ORAL | Status: DC

## 2015-01-26 MED ORDER — METFORMIN HCL ER 500 MG PO TB24: 500.0000 mg | ORAL_TABLET | Freq: Two times a day (BID) | ORAL | Status: DC

## 2015-01-26 NOTE — Progress Notes (Signed)
Subjective:    Patient ID: Barbara Lynch, female    DOB: Nov 12, 1960, 54 y.o.   MRN: [id number]  HPI pt states DM was dx'ed in 2010; he has mild if any neuropathy of the lower extremities, but she has associated nephropathy; he has never been on insulin; pt says his diet and exercise are good; he has never had GDM, pancreatitis, severe hypoglycemia or DKA.  She did not tolerate metformin, due to exac of her IBS.   Past Medical History  Diagnosis Date  . Anxiety   . Anginal pain     " panic attack" and chest tight- 1 episode   . Bronchitis   . Nasal polyp     right nostril   . Deviated septum   . Hypertension   . Diabetes mellitus without complication   . Left hip pain     also in left leg- ? etiology   . Urinary tract infection     hx of   . Arthritis     pinched nerve in neck   . Abnormal Pap smear   . GERD (gastroesophageal reflux disease)   . Hypertension   . IBS (irritable bowel syndrome)     Past Surgical History  Procedure Laterality Date  . Anterior cervical decomp/discectomy fusion    . Cesarean section      x2  . Tubal ligation      Hukal clips   . Abdominal hysterectomy      abdominal hysterectomy   . Salpingoophorectomy  07/30/2012    Procedure: SALPINGO OOPHORECTOMY;  Surgeon: Alvino Chapel, MD;  Location: [facility];  Service: Gynecology;  Laterality: Left;     . Cervical cone biopsy      History   Social History  . Marital Status: Married    Spouse Name: N/A  . Number of Children: N/A  . Years of Education: N/A   Occupational History  . Not on file.   Social History Main Topics  . Smoking status: Former Smoker    Types: Cigarettes    Quit date: 07/18/1998  . Smokeless tobacco: Never Used  . Alcohol Use: No  . Drug Use: No  . Sexual Activity: Yes    Birth Control/ Protection: Surgical     Comment: TAH   Other Topics Concern  . Not on file   Social History Narrative    Current Outpatient Prescriptions on File Prior to Visit    Medication Sig Dispense Refill  . cyclobenzaprine (FLEXERIL) 5 MG tablet Take 1 tablet (5 mg total) by mouth 3 (three) times daily as needed for muscle spasms. 30 tablet 0  . ibuprofen (ADVIL,MOTRIN) 200 MG tablet Take 800 mg by mouth every 6 (six) hours as needed. Pain    . lisinopril (PRINIVIL,ZESTRIL) 20 MG tablet Take 20 mg by mouth at bedtime.     Marland Kitchen oxyCODONE-acetaminophen (PERCOCET/ROXICET) 5-325 MG per tablet Take 1-2 tablets by mouth every 4 (four) hours as needed (moderate to severe pain). 60 tablet 0  . Polyethylene Glycol 3350 (MIRALAX PO) Take 17 g by mouth daily.     . simvastatin (ZOCOR) 20 MG tablet Take 20 mg by mouth every evening.     No current facility-administered medications on file prior to visit.    Allergies  Allergen Reactions  . Flagyl [Metronidazole]     Nausea and sea sickness  . Naprosyn [Naproxen] Other (See Comments)    Blood in stool    Family History  Problem Relation Age  of Onset  . Diabetes Maternal Grandmother   . Heart disease Maternal Grandmother     pacemaker, CVA  . Hypertension Father   . Aneurysm Mother     short term memory loss  . Endometriosis Sister     BP 136/86 mmHg  Pulse 88  Temp(Src) 98.2 F (36.8 C) (Oral)  Ht 5\' 4"  (1.626 m)  Wt 193 lb (87.544 kg)  BMI 33.11 kg/m2  SpO2 94%  LMP 08/28/2004    Review of Systems denies weight loss, blurry vision, headache, chest pain, sob, n/v, urinary frequency, muscle cramps, excessive diaphoresis, depression, and cold intolerance.  she has rhinorrhea, excessive diaphoresis, and easy bruising.       Objective:   Physical Exam VS: see vs page GEN: no distress HEAD: head: no deformity eyes: no periorbital swelling, no proptosis external nose and ears are normal mouth: no lesion seen NECK: supple, thyroid is not enlarged CHEST WALL: no deformity LUNGS:  Clear to auscultation. CV: reg rate and rhythm, no murmur ABD: abdomen is soft, nontender.  no hepatosplenomegaly.  not  distended.  no hernia MUSCULOSKELETAL: muscle bulk and strength are grossly normal.  no obvious joint swelling.  gait is normal and steady EXTEMITIES: no deformity.  no ulcer on the feet.  feet are of normal color and temp.  no edema.  There is bilateral onychomycosis of the toenails.  PULSES: dorsalis pedis intact bilat.  no carotid bruit NEURO:  cn 2-12 grossly intact.   readily moves all 4's.  sensation is intact to touch on the feet SKIN:  Normal texture and temperature.  No rash or suspicious lesion is visible.   NODES:  None palpable at the neck PSYCH: alert, well-oriented.  Does not appear anxious nor depressed.   outside test results are reviewed: A1c=9.3.       Assessment & Plan:  DM: severe exacerbation.  She may achieve good glycemic control with multiple oral meds, which she says she prefers over insulin. IBS: new to me: i have sent a prescription, to change metformin to -XR. Obesity: this complicates the rx of DM.   Patient is advised the following: Patient Instructions  good diet and exercise significantly improve the control of your diabetes.  please let me know if you wish to be referred to a dietician.  high blood sugar is very risky to your health.  you should see an eye doctor and dentist every year.  It is very important to get all recommended vaccinations.  controlling your blood pressure and cholesterol drastically reduces the damage diabetes does to your body.  Those who smoke should quit.  please discuss these with your doctor.  check your blood sugar once a day.  vary the time of day when you check, between before the 3 meals, and at bedtime.  also check if you have symptoms of your blood sugar being too high or too low.  please keep a record of the readings and bring it to your next appointment here.  You can write it on any piece of paper.  please call us sooner if your blood sugar goes below 70, or if you have a lot of readings over 200. At our office, we are  fortunate to have two specialists who are happy to help you:   Leonia Reader, RN, CDE, is a diabetes educator and pump trainer.  She is here on Monday mornings, and all day Tuesday and Wednesday.  She is can help you with low blood sugar avoidance  and treatment, injecting insulin, sick day management, and others.   Antonieta Iba, RD is our dietician.  She is here all day Thursday and Friday.  She can advise you about a healthy diet.  She can also help you about a variety of special diabetes situations, such as shift work, Actor, gluten-free, diet for kidney patients, traveling with diabetes, and help for those who need to gain weight.   Please consider having weight loss surgery.  It is good for your health.  Here is some information about it.  If you decide to consider further, please call the phone number in the papers, and register for a free informational meeting.   i have sent a prescription to your pharmacy, to add "invokana."  Please call next week if it is still high so we can add "Tonga."  Please come back for a follow-up appointment in 4-6 weeks.

## 2015-01-26 NOTE — Patient Instructions (Addendum)
good diet and exercise significantly improve the control of your diabetes.  please let me know if you wish to be referred to a dietician.  high blood sugar is very risky to your health.  you should see an eye doctor and dentist every year.  It is very important to get all recommended vaccinations.  controlling your blood pressure and cholesterol drastically reduces the damage diabetes does to your body.  Those who smoke should quit.  please discuss these with your doctor.  check your blood sugar once a day.  vary the time of day when you check, between before the 3 meals, and at bedtime.  also check if you have symptoms of your blood sugar being too high or too low.  please keep a record of the readings and bring it to your next appointment here.  You can write it on any piece of paper.  please call us sooner if your blood sugar goes below 70, or if you have a lot of readings over 200. At our office, we are fortunate to have two specialists who are happy to help you:   Leonia Reader, RN, CDE, is a diabetes educator and pump trainer.  She is here on Monday mornings, and all day Tuesday and Wednesday.  She is can help you with low blood sugar avoidance and treatment, injecting insulin, sick day management, and others.   Antonieta Iba, RD is our dietician.  She is here all day Thursday and Friday.  She can advise you about a healthy diet.  She can also help you about a variety of special diabetes situations, such as shift work, Actor, gluten-free, diet for kidney patients, traveling with diabetes, and help for those who need to gain weight.   Please consider having weight loss surgery.  It is good for your health.  Here is some information about it.  If you decide to consider further, please call the phone number in the papers, and register for a free informational meeting.   i have sent a prescription to your pharmacy, to add "invokana."  Please call next week if it is still high so we can add  "Tonga."  Please come back for a follow-up appointment in 4-6 weeks.

## 2015-01-26 NOTE — Telephone Encounter (Signed)
Prescriptions for Invokana and Metformin submitted to [facility].

## 2015-01-26 NOTE — Telephone Encounter (Signed)
Patient stated that send all her medication to [facility] on [address].

## 2015-01-27 ENCOUNTER — Telehealth: Payer: Self-pay | Admitting: Endocrinology

## 2015-01-27 NOTE — Telephone Encounter (Signed)
Patient need a refill of metformin 500 mg, send to [place], [place] [phone] (Phone) [phone] (Fax)

## 2015-01-27 NOTE — Telephone Encounter (Signed)
Rx was sent on 01/26/2015. Patient notified rx has been sent.

## 2015-02-26 ENCOUNTER — Telehealth: Payer: Self-pay | Admitting: Endocrinology

## 2015-02-26 MED ORDER — GLUCOSE BLOOD VI STRP: ORAL_STRIP | Status: AC

## 2015-02-26 NOTE — Telephone Encounter (Signed)
Rx sent per pt's request.  

## 2015-02-26 NOTE — Telephone Encounter (Signed)
Please call in test strips for one touch to [facility] on [address]

## 2015-03-04 ENCOUNTER — Ambulatory Visit: Payer: 59 | Admitting: Dietician

## 2015-03-04 ENCOUNTER — Ambulatory Visit: Payer: 59 | Admitting: Endocrinology

## 2015-03-05 ENCOUNTER — Ambulatory Visit (INDEPENDENT_AMBULATORY_CARE_PROVIDER_SITE_OTHER): Payer: 59 | Admitting: Endocrinology

## 2015-03-05 ENCOUNTER — Encounter: Payer: Self-pay | Admitting: Endocrinology

## 2015-03-05 ENCOUNTER — Encounter: Payer: Self-pay | Admitting: Dietician

## 2015-03-05 ENCOUNTER — Encounter: Payer: 59 | Attending: Endocrinology | Admitting: Dietician

## 2015-03-05 VITALS — BP 114/70 | HR 63 | Temp 97.9°F | Ht 64.0 in | Wt 176.0 lb

## 2015-03-05 VITALS — Ht 64.0 in | Wt 176.0 lb

## 2015-03-05 DIAGNOSIS — Z713 Dietary counseling and surveillance: Secondary | ICD-10-CM | POA: Diagnosis not present

## 2015-03-05 DIAGNOSIS — E1121 Type 2 diabetes mellitus with diabetic nephropathy: Secondary | ICD-10-CM | POA: Insufficient documentation

## 2015-03-05 LAB — POCT GLYCOSYLATED HEMOGLOBIN (HGB A1C): Hemoglobin A1C: 7.3

## 2015-03-05 NOTE — Patient Instructions (Addendum)
check your blood sugar once a day.  vary the time of day when you check, between before the 3 meals, and at bedtime.  also check if you have symptoms of your blood sugar being too high or too low.  please keep a record of the readings and bring it to your next appointment here.  You can write it on any piece of paper.  please call us sooner if your blood sugar goes below 70, or if you have a lot of readings over 200.    Please continue the same medications.   Also, please continue your dietary efforts. blood tests are requested for you today.  We'll let you know about the results.   Please come back for a follow-up appointment in 3 months.

## 2015-03-05 NOTE — Progress Notes (Signed)
Subjective:    Patient ID: Barbara Lynch, female    DOB: 08/03/1961, 54 y.o.   MRN: [id number]  HPI  Pt returns for f/u of diabetes mellitus: DM type: 2 Dx'ed: 6811 Complications: none Therapy: 2 oral meds GDM: never DKA: never Severe hypoglycemia: never Pancreatitis: never Other: she has never been on insulin; she declines weight loss surgery.   Interval history: she has lost weight, due to her efforts.  she brings a record of her cbg's which i have reviewed today.  All are in the low to mid-100's.   Past Medical History  Diagnosis Date  . Anxiety   . Anginal pain     " panic attack" and chest tight- 1 episode   . Bronchitis   . Nasal polyp     right nostril   . Deviated septum   . Hypertension   . Diabetes mellitus without complication   . Left hip pain     also in left leg- ? etiology   . Urinary tract infection     hx of   . Arthritis     pinched nerve in neck   . Abnormal Pap smear   . GERD (gastroesophageal reflux disease)   . Hypertension   . IBS (irritable bowel syndrome)     Past Surgical History  Procedure Laterality Date  . Anterior cervical decomp/discectomy fusion    . Cesarean section      x2  . Tubal ligation      Hukal clips   . Abdominal hysterectomy      abdominal hysterectomy   . Salpingoophorectomy  07/30/2012    Procedure: SALPINGO OOPHORECTOMY;  Surgeon: Alvino Chapel, MD;  Location: [facility];  Service: Gynecology;  Laterality: Left;     . Cervical cone biopsy      History   Social History  . Marital Status: Married    Spouse Name: N/A  . Number of Children: N/A  . Years of Education: N/A   Occupational History  . Not on file.   Social History Main Topics  . Smoking status: Former Smoker    Types: Cigarettes    Quit date: 07/18/1998  . Smokeless tobacco: Never Used  . Alcohol Use: No  . Drug Use: No  . Sexual Activity: Yes    Birth Control/ Protection: Surgical     Comment: TAH   Other Topics Concern  . Not  on file   Social History Narrative    Current Outpatient Prescriptions on File Prior to Visit  Medication Sig Dispense Refill  . canagliflozin (INVOKANA) 300 MG TABS tablet Take 300 mg by mouth daily. 30 tablet 11  . cyclobenzaprine (FLEXERIL) 5 MG tablet Take 1 tablet (5 mg total) by mouth 3 (three) times daily as needed for muscle spasms. (Patient not taking: Reported on 03/05/2015) 30 tablet 0  . glucose blood (ONE TOUCH TEST STRIPS) test strip Check blood sugar 1 time per day 100 each 2  . ibuprofen (ADVIL,MOTRIN) 200 MG tablet Take 800 mg by mouth every 6 (six) hours as needed. Pain    . lisinopril (PRINIVIL,ZESTRIL) 20 MG tablet Take 20 mg by mouth at bedtime.     . metFORMIN (GLUCOPHAGE-XR) 500 MG 24 hr tablet Take 1 tablet (500 mg total) by mouth 2 (two) times daily. 60 tablet 11  . oxyCODONE-acetaminophen (PERCOCET/ROXICET) 5-325 MG per tablet Take 1-2 tablets by mouth every 4 (four) hours as needed (moderate to severe pain). (Patient not taking: Reported on 03/05/2015)  60 tablet 0  . Polyethylene Glycol 3350 (MIRALAX PO) Take 17 g by mouth daily.     . simvastatin (ZOCOR) 20 MG tablet Take 20 mg by mouth every evening.     No current facility-administered medications on file prior to visit.    Allergies  Allergen Reactions  . Flagyl [Metronidazole]     Nausea and sea sickness  . Naprosyn [Naproxen] Other (See Comments)    Blood in stool    Family History  Problem Relation Age of Onset  . Diabetes Maternal Grandmother   . Heart disease Maternal Grandmother     pacemaker, CVA  . Hypertension Father   . Aneurysm Mother     short term memory loss  . Endometriosis Sister     BP 114/70 mmHg  Pulse 63  Temp(Src) 97.9 F (36.6 C) (Oral)  Ht 5\' 4"  (1.626 m)  Wt 176 lb (79.833 kg)  BMI 30.20 kg/m2  SpO2 97%  LMP 08/28/2004  Review of Systems Denies urinary frequency.      Objective:   Physical Exam VITAL SIGNS:  See vs page GENERAL: no distress Pulses: dorsalis  pedis intact bilat.   MSK: no deformity of the feet CV: no leg edema Skin:  no ulcer on the feet.  normal color and temp on the feet. Neuro: sensation is intact to touch on the feet  A1c=7.3%     Assessment & Plan:  DM: glycemic control is much better with weight loss.    Patient is advised the following: Patient Instructions  check your blood sugar once a day.  vary the time of day when you check, between before the 3 meals, and at bedtime.  also check if you have symptoms of your blood sugar being too high or too low.  please keep a record of the readings and bring it to your next appointment here.  You can write it on any piece of paper.  please call us sooner if your blood sugar goes below 70, or if you have a lot of readings over 200.    Please continue the same medications.   Also, please continue your dietary efforts. blood tests are requested for you today.  We'll let you know about the results.   Please come back for a follow-up appointment in 3 months.

## 2015-03-05 NOTE — Patient Instructions (Signed)
What can you do for stress management? Continue the exercise.  Great job!  Continue to be a mindful eater.

## 2015-03-05 NOTE — Progress Notes (Signed)
  Medical Nutrition Therapy:  Appt start time: 0930 end time:  1030.   Assessment:  Primary concerns today: Patient is here alone.  She would like to learn to make healthy choices in a group setting.  She reports that she was very thin and active including gymnastics until pregancies.  Hx includes type 2 diabetes with neuropathy.  HgbA1C of 9.3% in may and decreased to 7.3% 03/05/15.  She reports losing 23 lbs in the past 6 weeks by increasing exercise, decreasing fat and being more mindful about her eating.  She has a hx of yo-yo dieting.  She states that she is now working on being a more Dealer.    She lives with her husband.  She does the shopping and cooking and also cooks for her in-laws with COPD and Alzheimer's twice a week.  She works at home for [employer].  She is active and is exercising 45 minutes to 1 hour daily on a bike, walking, or elliptical.  She breaks the time up throughout the day.  She eats out 3-4 times per week.    Preferred Learning Style:   No preference indicated   Learning Readiness:   Ready  Change in progress   MEDICATIONS: see list to include Invokana and Glucophage.  Patient reports that she will only stay on the [place] for another 6 months until she loses additional weight.   DIETARY INTAKE:   Usual eating pattern includes 3 meals and 1 snacks per day.  24-hr recall:  B ( AM): Yogurt and fruit OR 1 packet oatmeal and fruit OR [place] sausage/egg white sandwich OR cereal and fruit  Snk ( AM):   L ( PM): Lean Cuisine with meat and veges or [place] sandwich on [employer] bread and salad Snk ( PM): fruit occasionally, celery, or small handful nuts and mixed berries D ( PM): Grilled chicken or fish, steamed veges, sweet potato or brown rice or potatoes Snk ( PM):  Beverages: water  Usual physical activity: biking walking elliptical 45 minutes to 1 hour per day plus basic yoga  Estimated energy needs: 1500 calories 170 g carbohydrates 94 g  protein 50 g fat  Progress Towards Goal(s):  In progress.   Nutritional Diagnosis:  NB-1.1 Food and nutrition-related knowledge deficit As related to weight managment.  As evidenced by patient report.    Intervention:  Nutrition counseling/education related to basic meal planning with diabetes and intuitive eating fore weight management.  Discussed being informed about the menu of restaurants that she will be eating.  Discussed bringing a dish that she can eat to group events and eat mindfully at those occasions choosing small amounts.  She reads labels. Visit shortened due to patient needing to return to work.  What can you do for stress management? Continue the exercise.  Great job!  Continue to be a mindful eater.  Teaching Method Utilized:  Visual Auditory Hands on  Handouts given during visit include:  Eating out  Meal plan card  Support group  Barriers to learning/adherence to lifestyle change: none  Demonstrated degree of understanding via:  Teach Back   Monitoring/Evaluation:  Dietary intake, exercise, and body weight prn.

## 2015-03-22 ENCOUNTER — Other Ambulatory Visit (INDEPENDENT_AMBULATORY_CARE_PROVIDER_SITE_OTHER): Payer: 59

## 2015-03-22 DIAGNOSIS — E1121 Type 2 diabetes mellitus with diabetic nephropathy: Secondary | ICD-10-CM | POA: Diagnosis not present

## 2015-03-22 LAB — BASIC METABOLIC PANEL
BUN: 16 mg/dL (ref 6–23)
CHLORIDE: 104 meq/L (ref 96–112)
CO2: 27 meq/L (ref 19–32)
Calcium: 10 mg/dL (ref 8.4–10.5)
Creatinine, Ser: 0.77 mg/dL (ref 0.40–1.20)
GFR: 83.74 mL/min (ref >60.00)
Glucose, Bld: 86 mg/dL (ref 70–99)
Potassium: 3.8 mEq/L (ref 3.5–5.1)
SODIUM: 139 meq/L (ref 135–145)

## 2015-05-21 ENCOUNTER — Telehealth: Payer: Self-pay | Admitting: Endocrinology

## 2015-05-21 MED ORDER — ONETOUCH ULTRA SYSTEM W/DEVICE KIT: 1.0000 | PACK | Freq: Once | Status: AC

## 2015-05-21 NOTE — Telephone Encounter (Signed)
i have sent a prescription to your pharmacy 

## 2015-05-21 NOTE — Telephone Encounter (Signed)
Do you approve of this? Please advise

## 2015-05-21 NOTE — Telephone Encounter (Signed)
Patient stated that she lost her one touch meter could you pease call her in another one, please advise

## 2015-05-21 NOTE — Addendum Note (Signed)
Addended by: Renato Shin on: 05/21/2015 04:57 PM   Modules accepted: Orders

## 2015-07-30 ENCOUNTER — Encounter: Payer: Self-pay | Admitting: Endocrinology

## 2015-07-30 ENCOUNTER — Ambulatory Visit (INDEPENDENT_AMBULATORY_CARE_PROVIDER_SITE_OTHER): Payer: 59 | Admitting: Endocrinology

## 2015-07-30 VITALS — BP 130/82 | HR 87 | Temp 98.2°F | Ht 64.0 in | Wt 174.0 lb

## 2015-07-30 DIAGNOSIS — E1121 Type 2 diabetes mellitus with diabetic nephropathy: Secondary | ICD-10-CM | POA: Diagnosis not present

## 2015-07-30 MED ORDER — METFORMIN HCL ER 500 MG PO TB24: 500.0000 mg | ORAL_TABLET | Freq: Two times a day (BID) | ORAL | Status: DC

## 2015-07-30 MED ORDER — LISINOPRIL 20 MG PO TABS: 20.0000 mg | ORAL_TABLET | Freq: Every day | ORAL | Status: DC

## 2015-07-30 MED ORDER — SIMVASTATIN 20 MG PO TABS: 20.0000 mg | ORAL_TABLET | Freq: Every evening | ORAL | Status: DC

## 2015-07-30 NOTE — Progress Notes (Signed)
Subjective:    Patient ID: Barbara Lynch, female    DOB: 1961-03-20, 54 y.o.   MRN: [id number]  HPI Pt returns for f/u of diabetes mellitus: DM type: 2 Dx'ed: 9201 Complications: none Therapy: 2 oral meds GDM: never DKA: never Severe hypoglycemia: never Pancreatitis: never Other: she has never been on insulin; she declines weight loss surgery.   Interval history: no cbg record, but states cbg's are well-controlled.  pt states she feels well in general. Past Medical History  Diagnosis Date  . Anxiety   . Anginal pain ([place])     " panic attack" and chest tight- 1 episode   . Bronchitis   . Nasal polyp     right nostril   . Deviated septum   . Hypertension   . Diabetes mellitus without complication ([place])   . Left hip pain     also in left leg- ? etiology   . Urinary tract infection     hx of   . Arthritis     pinched nerve in neck   . Abnormal Pap smear   . GERD (gastroesophageal reflux disease)   . Hypertension   . IBS (irritable bowel syndrome)     Past Surgical History  Procedure Laterality Date  . Anterior cervical decomp/discectomy fusion    . Cesarean section      x2  . Tubal ligation      Hukal clips   . Abdominal hysterectomy      abdominal hysterectomy   . Salpingoophorectomy  07/30/2012    Procedure: SALPINGO OOPHORECTOMY;  Surgeon: Alvino Chapel, MD;  Location: [facility];  Service: Gynecology;  Laterality: Left;     . Cervical cone biopsy      Social History   Social History  . Marital Status: Married    Spouse Name: N/A  . Number of Children: N/A  . Years of Education: N/A   Occupational History  . Not on file.   Social History Main Topics  . Smoking status: Former Smoker    Types: Cigarettes    Quit date: 07/18/1998  . Smokeless tobacco: Never Used  . Alcohol Use: No  . Drug Use: No  . Sexual Activity: Yes    Birth Control/ Protection: Surgical     Comment: TAH   Other Topics Concern  . Not on file   Social History  Narrative    Current Outpatient Prescriptions on File Prior to Visit  Medication Sig Dispense Refill  . Blood Glucose Monitoring Suppl (ONE TOUCH ULTRA SYSTEM KIT) W/DEVICE KIT 1 kit by Does not apply route once. 1 each 0  . canagliflozin (INVOKANA) 300 MG TABS tablet Take 300 mg by mouth daily. 30 tablet 11  . glucose blood (ONE TOUCH TEST STRIPS) test strip Check blood sugar 1 time per day 100 each 2  . ibuprofen (ADVIL,MOTRIN) 200 MG tablet Take 800 mg by mouth every 6 (six) hours as needed. Pain    . oxyCODONE-acetaminophen (PERCOCET/ROXICET) 5-325 MG per tablet Take 1-2 tablets by mouth every 4 (four) hours as needed (moderate to severe pain). 60 tablet 0  . Polyethylene Glycol 3350 (MIRALAX PO) Take 17 g by mouth daily.     . cyclobenzaprine (FLEXERIL) 5 MG tablet Take 1 tablet (5 mg total) by mouth 3 (three) times daily as needed for muscle spasms. (Patient not taking: Reported on 03/05/2015) 30 tablet 0   No current facility-administered medications on file prior to visit.    Allergies  Allergen  Reactions  . Flagyl [Metronidazole]     Nausea and sea sickness  . Naprosyn [Naproxen] Other (See Comments)    Blood in stool    Family History  Problem Relation Age of Onset  . Diabetes Maternal Grandmother   . Heart disease Maternal Grandmother     pacemaker, CVA  . Hypertension Father   . Aneurysm Mother     short term memory loss  . Endometriosis Sister     BP 130/82 mmHg  Pulse 87  Temp(Src) 98.2 F (36.8 C) (Oral)  Ht _0  (1.626 m)  Wt 174 lb (78.926 kg)  BMI 29.85 kg/m2  SpO2 97%  LMP 08/28/2004    Review of Systems She denies hypoglycemia.     Objective:   Physical Exam VITAL SIGNS:  See vs page GENERAL: no distress Pulses: dorsalis pedis intact bilat.   MSK: no deformity of the feet CV: no leg edema Skin:  no ulcer on the feet.  normal color and temp on the feet. Neuro: sensation is intact to touch on the feet.    A1c=6.7%    Assessment & Plan:    DM: well-controlled.   Patient is advised the following: Patient Instructions  Please continue the same medications.   i have sent prescriptions to your pharmacy, to refill the blood pressure and cholesterol meds, until you can get back in with Dr Nancy Fetter. Please come back for a follow-up appointment in 6 months.

## 2015-07-30 NOTE — Patient Instructions (Signed)
Please continue the same medications.   i have sent prescriptions to your pharmacy, to refill the blood pressure and cholesterol meds, until you can get back in with Dr Nancy Fetter. Please come back for a follow-up appointment in 6 months.

## 2015-11-21 ENCOUNTER — Other Ambulatory Visit: Payer: Self-pay | Admitting: Endocrinology

## 2015-12-27 ENCOUNTER — Other Ambulatory Visit: Payer: Self-pay | Admitting: Endocrinology

## 2015-12-27 ENCOUNTER — Telehealth: Payer: Self-pay | Admitting: Endocrinology

## 2015-12-27 MED ORDER — SIMVASTATIN 20 MG PO TABS: 20.0000 mg | ORAL_TABLET | Freq: Every evening | ORAL | Status: DC

## 2015-12-27 NOTE — Telephone Encounter (Signed)
Patient need a refill of her simvastatin (ZOCOR) 20 MG tablet, send to  [place], [place] [phone] (Phone) [phone] (Fax)

## 2015-12-28 NOTE — Telephone Encounter (Signed)
error 

## 2016-01-28 ENCOUNTER — Other Ambulatory Visit: Payer: 59

## 2016-01-28 ENCOUNTER — Ambulatory Visit: Payer: 59 | Admitting: Endocrinology

## 2016-02-02 ENCOUNTER — Ambulatory Visit: Payer: 59 | Admitting: Endocrinology

## 2016-02-21 ENCOUNTER — Other Ambulatory Visit (INDEPENDENT_AMBULATORY_CARE_PROVIDER_SITE_OTHER): Payer: 59

## 2016-02-21 ENCOUNTER — Other Ambulatory Visit: Payer: Self-pay

## 2016-02-21 DIAGNOSIS — E1121 Type 2 diabetes mellitus with diabetic nephropathy: Secondary | ICD-10-CM

## 2016-02-21 LAB — COMPREHENSIVE METABOLIC PANEL
ALBUMIN: 4.3 g/dL (ref 3.5–5.2)
ALT: 22 U/L (ref 0–35)
AST: 16 U/L (ref 0–37)
Alkaline Phosphatase: 61 U/L (ref 39–117)
BUN: 16 mg/dL (ref 6–23)
CALCIUM: 9.6 mg/dL (ref 8.4–10.5)
CHLORIDE: 98 meq/L (ref 96–112)
CO2: 28 meq/L (ref 19–32)
CREATININE: 0.72 mg/dL (ref 0.40–1.20)
GFR: 90.17 mL/min (ref >60.00)
Glucose, Bld: 189 mg/dL — ABNORMAL HIGH (ref 70–99)
Potassium: 3.8 mEq/L (ref 3.5–5.1)
Sodium: 134 mEq/L — ABNORMAL LOW (ref 135–145)
Total Bilirubin: 1.4 mg/dL — ABNORMAL HIGH (ref 0.2–1.2)
Total Protein: 6.6 g/dL (ref 6.0–8.3)

## 2016-02-21 LAB — LIPID PANEL
CHOL/HDL RATIO: 3
CHOLESTEROL: 155 mg/dL (ref 0–200)
HDL: 61.2 mg/dL (ref >39.00)
LDL CALC: 55 mg/dL (ref 0–99)
NonHDL: 93.9
TRIGLYCERIDES: 196 mg/dL — AB (ref 0.0–149.0)
VLDL: 39.2 mg/dL (ref 0.0–40.0)

## 2016-02-21 LAB — MICROALBUMIN / CREATININE URINE RATIO
Creatinine,U: 215.1 mg/dL
Microalb Creat Ratio: 1.2 mg/g (ref 0.0–30.0)
Microalb, Ur: 2.5 mg/dL — ABNORMAL HIGH (ref 0.0–1.9)

## 2016-02-21 LAB — HEMOGLOBIN A1C: Hgb A1c MFr Bld: 8.7 % — ABNORMAL HIGH (ref 4.6–6.5)

## 2016-02-24 ENCOUNTER — Ambulatory Visit: Payer: 59 | Admitting: Endocrinology

## 2016-03-06 ENCOUNTER — Encounter: Payer: Self-pay | Admitting: Endocrinology

## 2016-03-06 ENCOUNTER — Ambulatory Visit (INDEPENDENT_AMBULATORY_CARE_PROVIDER_SITE_OTHER): Payer: Self-pay | Admitting: Endocrinology

## 2016-03-06 VITALS — BP 122/84 | HR 90 | Wt 186.2 lb

## 2016-03-06 DIAGNOSIS — E1121 Type 2 diabetes mellitus with diabetic nephropathy: Secondary | ICD-10-CM

## 2016-03-06 MED ORDER — GLIMEPIRIDE 4 MG PO TABS: 4.0000 mg | ORAL_TABLET | Freq: Every day | ORAL | Status: DC

## 2016-03-06 NOTE — Patient Instructions (Addendum)
Please continue the same metformin.   I have sent a prescription to your pharmacy, to add "glimepiride."   check your blood sugar once every other day.  vary the time of day when you check, between before the 3 meals, and at bedtime.  also check if you have symptoms of your blood sugar being too high or too low.  please keep a record of the readings and bring it to your next appointment here (or you can bring the meter itself).  You can write it on any piece of paper.  please call us sooner if your blood sugar goes below 70, or if you have a lot of readings over 200. Please come back for a follow-up appointment in 4 months.

## 2016-03-06 NOTE — Progress Notes (Signed)
Subjective:    Patient ID: Barbara Lynch, female    DOB: 27-Aug-1961, 55 y.o.   MRN: [id number]  HPI Pt returns for f/u of diabetes mellitus:   DM type: 2.  Dx'ed: 2010.  Complications: none.   Therapy: 2 oral meds.   GDM: never.   DKA: never.   Severe hypoglycemia: never.   Pancreatitis: never.   Other: she has never been on insulin; she declines weight loss surgery.    Interval history: she stopped invokana, due to cost.  She has lost her insurance.  pt states she feels well in general. Past Medical History  Diagnosis Date  . Anxiety   . Anginal pain ([place])     " panic attack" and chest tight- 1 episode   . Bronchitis   . Nasal polyp     right nostril   . Deviated septum   . Hypertension   . Diabetes mellitus without complication ([place])   . Left hip pain     also in left leg- ? etiology   . Urinary tract infection     hx of   . Arthritis     pinched nerve in neck   . Abnormal Pap smear   . GERD (gastroesophageal reflux disease)   . Hypertension   . IBS (irritable bowel syndrome)     Past Surgical History  Procedure Laterality Date  . Anterior cervical decomp/discectomy fusion    . Cesarean section      x2  . Tubal ligation      Hukal clips   . Abdominal hysterectomy      abdominal hysterectomy   . Salpingoophorectomy  07/30/2012    Procedure: SALPINGO OOPHORECTOMY;  Surgeon: Alvino Chapel, MD;  Location: [facility];  Service: Gynecology;  Laterality: Left;     . Cervical cone biopsy      Social History   Social History  . Marital Status: Married    Spouse Name: N/A  . Number of Children: N/A  . Years of Education: N/A   Occupational History  . Not on file.   Social History Main Topics  . Smoking status: Former Smoker    Types: Cigarettes    Quit date: 07/18/1998  . Smokeless tobacco: Never Used  . Alcohol Use: No  . Drug Use: No  . Sexual Activity: Yes    Birth Control/ Protection: Surgical     Comment: TAH   Other Topics Concern  .  Not on file   Social History Narrative    Current Outpatient Prescriptions on File Prior to Visit  Medication Sig Dispense Refill  . Blood Glucose Monitoring Suppl (ONE TOUCH ULTRA SYSTEM KIT) W/DEVICE KIT 1 kit by Does not apply route once. 1 each 0  . cyclobenzaprine (FLEXERIL) 5 MG tablet Take 1 tablet (5 mg total) by mouth 3 (three) times daily as needed for muscle spasms. 30 tablet 0  . glucose blood (ONE TOUCH TEST STRIPS) test strip Check blood sugar 1 time per day 100 each 2  . ibuprofen (ADVIL,MOTRIN) 200 MG tablet Take 800 mg by mouth every 6 (six) hours as needed. Pain    . lisinopril (PRINIVIL,ZESTRIL) 20 MG tablet take 1 tablet by mouth at bedtime 30 tablet 2  . metFORMIN (GLUCOPHAGE-XR) 500 MG 24 hr tablet Take 1 tablet (500 mg total) by mouth 2 (two) times daily. 60 tablet 11  . oxyCODONE-acetaminophen (PERCOCET/ROXICET) 5-325 MG per tablet Take 1-2 tablets by mouth every 4 (four) hours as needed (moderate  to severe pain). 60 tablet 0  . Polyethylene Glycol 3350 (MIRALAX PO) Take 17 g by mouth daily.     . simvastatin (ZOCOR) 20 MG tablet Take 1 tablet (20 mg total) by mouth every evening. 30 tablet 2   No current facility-administered medications on file prior to visit.    Allergies  Allergen Reactions  . Flagyl [Metronidazole]     Nausea and sea sickness  . Naprosyn [Naproxen] Other (See Comments)    Blood in stool    Family History  Problem Relation Age of Onset  . Diabetes Maternal Grandmother   . Heart disease Maternal Grandmother     pacemaker, CVA  . Hypertension Father   . Aneurysm Mother     short term memory loss  . Endometriosis Sister     BP 122/84 mmHg  Pulse 90  Wt 186 lb 3.2 oz (84.46 kg)  SpO2 97%  LMP 08/28/2004  Review of Systems She has gained weight    Objective:   Physical Exam VITAL SIGNS:  See vs page GENERAL: no distress Pulses: dorsalis pedis intact bilat.   MSK: no deformity of the feet CV: no leg edema Skin:  no ulcer on  the feet.  normal color and temp on the feet. Neuro: sensation is intact to touch on the feet    Lab Results  Component Value Date   HGBA1C 8.7* 02/21/2016      Assessment & Plan:  Type 2 DM: therapy limited by pt's request for least expensive meds.  She needs increased rx  Patient is advised the following: Patient Instructions  Please continue the same metformin.   I have sent a prescription to your pharmacy, to add "glimepiride."   check your blood sugar once every other day.  vary the time of day when you check, between before the 3 meals, and at bedtime.  also check if you have symptoms of your blood sugar being too high or too low.  please keep a record of the readings and bring it to your next appointment here (or you can bring the meter itself).  You can write it on any piece of paper.  please call us sooner if your blood sugar goes below 70, or if you have a lot of readings over 200. Please come back for a follow-up appointment in 4 months.    Renato Shin, MD

## 2016-04-03 ENCOUNTER — Other Ambulatory Visit: Payer: Self-pay | Admitting: Internal Medicine

## 2016-07-07 ENCOUNTER — Encounter: Payer: Self-pay | Admitting: Endocrinology

## 2016-07-07 ENCOUNTER — Ambulatory Visit (INDEPENDENT_AMBULATORY_CARE_PROVIDER_SITE_OTHER): Payer: Self-pay | Admitting: Endocrinology

## 2016-07-07 ENCOUNTER — Other Ambulatory Visit: Payer: Self-pay | Admitting: Internal Medicine

## 2016-07-07 VITALS — BP 120/80 | HR 88 | Wt 180.0 lb

## 2016-07-07 DIAGNOSIS — E138 Other specified diabetes mellitus with unspecified complications: Secondary | ICD-10-CM

## 2016-07-07 LAB — POCT GLYCOSYLATED HEMOGLOBIN (HGB A1C): HEMOGLOBIN A1C: 11

## 2016-07-07 MED ORDER — INSULIN NPH (HUMAN) (ISOPHANE) 100 UNIT/ML ~~LOC~~ SUSP: 10.0000 [IU] | SUBCUTANEOUS | 11 refills | Status: DC

## 2016-07-07 NOTE — Progress Notes (Signed)
Subjective:    Patient ID: Barbara Lynch, female    DOB: 1960-09-07, 55 y.o.   MRN: [id number]  HPI Pt returns for f/u of diabetes mellitus:   DM type: 2.  Dx'ed: 2010.  Complications: none.   Therapy: 2 oral meds.   GDM: never.   DKA: never.   Severe hypoglycemia: never.   Pancreatitis: never.   Other: she has never been on insulin; she declines weight loss surgery;  she has lost her insurance, so she can only take generic meds.   Interval history: She stopped the amaryl, due to nausea.  She takes metformin as rx'ed.  She denies polyuria and sob.  She says she does not time to take care of her DM.  She says cbg's are in the 200's Past Medical History:  Diagnosis Date  . Abnormal Pap smear   . Anginal pain ([place])    " panic attack" and chest tight- 1 episode   . Anxiety   . Arthritis    pinched nerve in neck   . Bronchitis   . Deviated septum   . Diabetes mellitus without complication ([place])   . GERD (gastroesophageal reflux disease)   . Hypertension   . Hypertension   . IBS (irritable bowel syndrome)   . Left hip pain    also in left leg- ? etiology   . Nasal polyp    right nostril   . Urinary tract infection    hx of     Past Surgical History:  Procedure Laterality Date  . ABDOMINAL HYSTERECTOMY     abdominal hysterectomy   . ANTERIOR CERVICAL DECOMP/DISCECTOMY FUSION    . CERVICAL CONE BIOPSY    . CESAREAN SECTION     x2  . SALPINGOOPHORECTOMY  07/30/2012   Procedure: SALPINGO OOPHORECTOMY;  Surgeon: Alvino Chapel, MD;  Location: [facility];  Service: Gynecology;  Laterality: Left;     . TUBAL LIGATION     Hukal clips     Social History   Social History  . Marital status: Married    Spouse name: N/A  . Number of children: N/A  . Years of education: N/A   Occupational History  . Not on file.   Social History Main Topics  . Smoking status: Former Smoker    Types: Cigarettes    Quit date: 07/18/1998  . Smokeless tobacco: Never Used  .  Alcohol use No  . Drug use: No  . Sexual activity: Yes    Birth control/ protection: Surgical     Comment: TAH   Other Topics Concern  . Not on file   Social History Narrative  . No narrative on file    Current Outpatient Prescriptions on File Prior to Visit  Medication Sig Dispense Refill  . Blood Glucose Monitoring Suppl (ONE TOUCH ULTRA SYSTEM KIT) W/DEVICE KIT 1 kit by Does not apply route once. 1 each 0  . glucose blood (ONE TOUCH TEST STRIPS) test strip Check blood sugar 1 time per day 100 each 2  . metFORMIN (GLUCOPHAGE-XR) 500 MG 24 hr tablet Take 1 tablet (500 mg total) by mouth 2 (two) times daily. 60 tablet 11   No current facility-administered medications on file prior to visit.     Allergies  Allergen Reactions  . Flagyl [Metronidazole]     Nausea and sea sickness  . Amaryl [Glimepiride] Nausea Only  . Naprosyn [Naproxen] Other (See Comments)    Blood in stool    Family History  Problem Relation Age of Onset  . Diabetes Maternal Grandmother   . Heart disease Maternal Grandmother     pacemaker, CVA  . Hypertension Father   . Aneurysm Mother     short term memory loss  . Endometriosis Sister     BP 120/80   Pulse 88   Wt 180 lb (81.6 kg)   LMP 08/28/2004   SpO2 96%   BMI 30.90 kg/m   Review of Systems She denies hypoglycemia and vomiting.      Objective:   Physical Exam VITAL SIGNS:  See vs page GENERAL: no distress.   Pulses: dorsalis pedis intact bilat.   MSK: no deformity of the feet CV: no leg edema Skin:  no ulcer on the feet.  normal color and temp on the feet.  Neuro: sensation is intact to touch on the feet.   PSYCH: tearful.     A1c=11%  I demonstrated insulin syringe and vial injection.  Pt says she can do it    Assessment & Plan:  Type 2 DM: she needs insulin.   Patient is advised the following: Patient Instructions  Please continue the same metformin.   I have sent a prescription to your pharmacy, to add "NPH," 10  units each morning.   Please call us next week, to tell us how the blood sugar is doing.   On this type of insulin schedule, you should eat meals on a regular schedule.  If a meal is missed or significantly delayed, your blood sugar could go low.  check your blood sugar once every other day.  vary the time of day when you check, between before the 3 meals, and at bedtime.  also check if you have symptoms of your blood sugar being too high or too low.  please keep a record of the readings and bring it to your next appointment here (or you can bring the meter itself).  You can write it on any piece of paper.  please call us sooner if your blood sugar goes below 70, or if you have a lot of readings over 200. Please come back for a follow-up appointment in 3 months.

## 2016-07-07 NOTE — Patient Instructions (Addendum)
Please continue the same metformin.   I have sent a prescription to your pharmacy, to add "NPH," 10 units each morning.   Please call us next week, to tell us how the blood sugar is doing.   On this type of insulin schedule, you should eat meals on a regular schedule.  If a meal is missed or significantly delayed, your blood sugar could go low.  check your blood sugar once every other day.  vary the time of day when you check, between before the 3 meals, and at bedtime.  also check if you have symptoms of your blood sugar being too high or too low.  please keep a record of the readings and bring it to your next appointment here (or you can bring the meter itself).  You can write it on any piece of paper.  please call us sooner if your blood sugar goes below 70, or if you have a lot of readings over 200. Please come back for a follow-up appointment in 3 months.

## 2016-08-02 ENCOUNTER — Telehealth: Payer: Self-pay | Admitting: Endocrinology

## 2016-08-02 MED ORDER — INSULIN NPH (HUMAN) (ISOPHANE) 100 UNIT/ML ~~LOC~~ SUSP: 10.0000 [IU] | Freq: Every day | SUBCUTANEOUS | 11 refills | Status: AC

## 2016-08-02 NOTE — Telephone Encounter (Signed)
I contacted the patient and she stated the pharmacy needed a rx for novolin N and the refill that was submitted was Humulin. Rx changed to novolin and submitted. Patient advised to call back if she has any further questions.

## 2016-08-02 NOTE — Telephone Encounter (Signed)
The insulin I prescribed is $25 at [facility], without insurance.  Which [facility] do you want [place] to send it to?

## 2016-08-02 NOTE — Telephone Encounter (Signed)
Patient stated the insulin that was called into the pharmacy was over 287.00, she cannot afford that.  She did not know the name of it please advise.

## 2016-08-02 NOTE — Telephone Encounter (Signed)
See message and please advise, Thanks!  

## 2016-08-07 ENCOUNTER — Telehealth: Payer: Self-pay | Admitting: Endocrinology

## 2016-08-07 MED ORDER — SIMVASTATIN 20 MG PO TABS: 20.0000 mg | ORAL_TABLET | Freq: Every evening | ORAL | 2 refills | Status: AC

## 2016-08-07 MED ORDER — LISINOPRIL 20 MG PO TABS: 20.0000 mg | ORAL_TABLET | Freq: Every day | ORAL | 2 refills | Status: AC

## 2016-08-07 NOTE — Telephone Encounter (Signed)
Need refill today  Please patient ask. simvastatin (ZOCOR) 20 MG tablet lisinopril (PRINIVIL,ZESTRIL) 20 MG tablet metFORMIN (GLUCOPHAGE-XR) 500 MG 24 hr tablet   [id number] [place], [place] - [address] [phone] (Phone) [phone] (Fax)

## 2016-08-07 NOTE — Telephone Encounter (Signed)
Pt called back and said that the pharmacy needs a new prescription for her Metformin, the one they have is expired.

## 2016-08-07 NOTE — Telephone Encounter (Signed)
Refills for Metformin, simvastatin, and lisinopril all have been submitted to the pharmacy.

## 2016-10-05 ENCOUNTER — Ambulatory Visit: Payer: Self-pay | Admitting: Endocrinology

## 2017-08-29 ENCOUNTER — Other Ambulatory Visit: Payer: Self-pay

## 2017-08-29 MED ORDER — METFORMIN HCL ER 500 MG PO TB24: 500.0000 mg | ORAL_TABLET | Freq: Two times a day (BID) | ORAL | 11 refills | Status: AC

## 2018-08-06 ENCOUNTER — Encounter (HOSPITAL_COMMUNITY): Payer: Self-pay

## 2018-08-06 ENCOUNTER — Ambulatory Visit (INDEPENDENT_AMBULATORY_CARE_PROVIDER_SITE_OTHER): Payer: Self-pay

## 2018-08-06 ENCOUNTER — Ambulatory Visit (HOSPITAL_COMMUNITY)
Admission: EM | Admit: 2018-08-06 | Discharge: 2018-08-06 | Disposition: A | Discharge status: Home / Self Care | Payer: Self-pay | Attending: Family Medicine | Admitting: Family Medicine

## 2018-08-06 DIAGNOSIS — T148XXA Other injury of unspecified body region, initial encounter: Secondary | ICD-10-CM | POA: Insufficient documentation

## 2018-08-06 DIAGNOSIS — S60551A Superficial foreign body of right hand, initial encounter: Secondary | ICD-10-CM

## 2018-08-06 MED ORDER — CEPHALEXIN 500 MG PO CAPS
500.0000 mg | ORAL_CAPSULE | Freq: Three times a day (TID) | ORAL | 0 refills | Status: AC
Start: 2018-08-06 — End: 2018-08-16

## 2018-08-06 MED ORDER — TETANUS-DIPHTH-ACELL PERTUSSIS 5-2.5-18.5 LF-MCG/0.5 IM SUSP
INTRAMUSCULAR | Status: AC
  Filled 2018-08-06: qty 0.5

## 2018-08-06 MED ORDER — TETANUS-DIPHTH-ACELL PERTUSSIS 5-2.5-18.5 LF-MCG/0.5 IM SUSP
0.5000 mL | Freq: Once | INTRAMUSCULAR | Status: AC
  Administered 2018-08-06: 0.5 mL via INTRAMUSCULAR

## 2018-08-06 NOTE — ED Provider Notes (Signed)
08/06/2018 5:52 PM   DOB: 1960/09/16 / MRN: [id number]  SUBJECTIVE:  Barbara Lynch is a 57 y.o. female presenting for fishhook in hand injury this was sustained today.  Her husband has already cut half of the fishing lower than half.  [place] advised him to have a tetanus shot.  She tells me her hand is working fine but that her hand just hurts.    She is allergic to flagyl [metronidazole]; amaryl [glimepiride]; and naprosyn [naproxen].   She  has a past medical history of Abnormal Pap smear, Anginal pain (HCC), Anxiety, Arthritis, Bronchitis, Deviated septum, Diabetes mellitus without complication ([place]), GERD (gastroesophageal reflux disease), Hypertension, Hypertension, IBS (irritable bowel syndrome), Left hip pain, Nasal polyp, and Urinary tract infection.    She  reports that she quit smoking about 20 years ago. Her smoking use included cigarettes. She has never used smokeless tobacco. She reports that she does not drink alcohol or use drugs. She  reports that she currently engages in sexual activity. She reports using the following method of birth control/protection: Surgical. The patient  has a past surgical history that includes Anterior cervical decomp/discectomy fusion; Cesarean section; Tubal ligation; Abdominal hysterectomy; Salpingoophorectomy (07/30/2012); and Cervical cone biopsy.  Her family history includes Aneurysm in her mother; Diabetes in her maternal grandmother; Endometriosis in her sister; Heart disease in her maternal grandmother; Hypertension in her father.  Review of Systems  Constitutional: Negative for chills, diaphoresis and fever.  Respiratory: Negative for shortness of breath.   Cardiovascular: Negative for chest pain, orthopnea and leg swelling.  Gastrointestinal: Negative for nausea.  Skin: Negative for rash.  Neurological: Negative for dizziness and focal weakness.    OBJECTIVE:  BP (!) 147/79 (BP Location: Right Arm)   Pulse 84   Temp 97.9 F (36.6 C)  (Oral)   Resp 20   LMP 08/28/2004   SpO2 99%   Wt Readings from Last 3 Encounters:  07/07/16 180 lb (81.6 kg)  03/06/16 186 lb 3.2 oz (84.5 kg)  07/30/15 174 lb (78.9 kg)   Temp Readings from Last 3 Encounters:  08/06/18 97.9 F (36.6 C) (Oral)  07/30/15 98.2 F (36.8 C) (Oral)  03/05/15 97.9 F (36.6 C) (Oral)   BP Readings from Last 3 Encounters:  08/06/18 (!) 147/79  07/07/16 120/80  03/06/16 122/84   Pulse Readings from Last 3 Encounters:  08/06/18 84  07/07/16 88  03/06/16 90    Physical Exam  Constitutional: She is oriented to person, place, and time. She appears well-developed.  Eyes: Pupils are equal, round, and reactive to light. EOM are normal.  Cardiovascular: Normal rate.  Pulmonary/Chest: Effort normal.  Abdominal: She exhibits no distension.  Musculoskeletal: Normal range of motion.       Hands: Neurological: She is alert and oriented to person, place, and time. No cranial nerve deficit.  Skin: Skin is warm and dry. She is not diaphoretic.  Psychiatric: She has a normal mood and affect.  Vitals reviewed.  Procedure: Patient anesthetized with 2% lidocaine with epi.  Gentle traction applied to the fishhook and using a 15 blade a small incision was made superficially in the fishhook was removed intact.  No results found for this or any previous visit (from the past 72 hour(s)).  No results found.  ASSESSMENT AND PLAN:   Penetrating injury - Fish hook in hand removed.  Advise she will have quite a lot of pain tonight.  Narcotics were offered patient refused.  Reports she will take Aleve and  Tylenol.  Started on Keflex given the penetrating injury and will updating her tetanus today.    Discharge Instructions     Take naprosyn and tylenol as needed.  Take the antibiotic. Wash the wound 3-4 times a day. Come back if you need to.         The patient is advised to call or return to clinic if she does not see an improvement in symptoms, or to seek  the care of the closest emergency department if she worsens with the above plan.   Philis Fendt, MHS, PA-C 08/06/2018 5:52 PM   Tereasa Coop, PA-C 08/06/18 1753

## 2018-08-06 NOTE — Discharge Instructions (Signed)
Take naprosyn and tylenol as needed.  Take the antibiotic. Wash the wound 3-4 times a day. Come back if you need to.

## 2018-08-06 NOTE — ED Triage Notes (Signed)
Pt presents with fish hook in knuckle of right hand.

## 2018-08-13 ENCOUNTER — Telehealth (HOSPITAL_COMMUNITY): Payer: Self-pay | Admitting: Emergency Medicine

## 2018-08-13 NOTE — Telephone Encounter (Signed)
Attempted to reach patient to see how she was doing. No answer

## 2018-08-13 NOTE — Telephone Encounter (Signed)
-----   Message from Tereasa Coop, PA-C sent at 08/13/2018  8:32 AM EST ----- Thanks for checking.  We could call the patient and see how she is doing.  The foreign body noted on the radiograph is in a different area than the location of the fish hook.

## 2019-07-17 ENCOUNTER — Other Ambulatory Visit: Payer: Self-pay

## 2019-07-17 DIAGNOSIS — Z20822 Contact with and (suspected) exposure to covid-19: Secondary | ICD-10-CM

## 2019-07-19 ENCOUNTER — Telehealth: Payer: Self-pay

## 2019-07-19 LAB — NOVEL CORONAVIRUS, NAA: SARS-CoV-2, NAA: NOT DETECTED

## 2019-07-19 NOTE — Telephone Encounter (Signed)
Pt called wanting covid test results-advised that results are not back. Assisted pt with MyChart.

## 2019-12-09 ENCOUNTER — Ambulatory Visit: Payer: Self-pay | Attending: Orthopedic | Admitting: Physical Therapy

## 2019-12-09 ENCOUNTER — Other Ambulatory Visit: Payer: Self-pay

## 2019-12-09 DIAGNOSIS — R252 Cramp and spasm: Secondary | ICD-10-CM

## 2019-12-09 DIAGNOSIS — M6281 Muscle weakness (generalized): Secondary | ICD-10-CM

## 2019-12-09 DIAGNOSIS — M5441 Lumbago with sciatica, right side: Secondary | ICD-10-CM

## 2019-12-09 NOTE — Therapy (Signed)
[place], [place], [address] Phone: [phone]   Fax:  [phone]  Physical Therapy Evaluation  Patient Details  Name: Barbara Lynch MRN: [id number] Date of Birth: 01/25/1961 Referring Provider (PT): Havery Moros MD   Encounter Date: 12/09/2019  PT End of Session - 12/09/19 1255    Visit Number  1    Number of Visits  13    Date for PT Re-Evaluation  01/20/20    Authorization Type  SELF pay    PT Start Time  [id number]    PT Stop Time  1240    PT Time Calculation (min)  55 min    Activity Tolerance  Patient tolerated treatment well;Patient limited by pain    Behavior During Therapy  [facility] for tasks assessed/performed       Past Medical History:  Diagnosis Date  . Abnormal Pap smear   . Anginal pain ([place])    " panic attack" and chest tight- 1 episode   . Anxiety   . Arthritis    pinched nerve in neck   . Bronchitis   . Deviated septum   . Diabetes mellitus without complication ([place])   . GERD (gastroesophageal reflux disease)   . Hypertension   . Hypertension   . IBS (irritable bowel syndrome)   . Left hip pain    also in left leg- ? etiology   . Nasal polyp    right nostril   . Urinary tract infection    hx of     Past Surgical History:  Procedure Laterality Date  . ABDOMINAL HYSTERECTOMY     abdominal hysterectomy   . ANTERIOR CERVICAL DECOMP/DISCECTOMY FUSION    . CERVICAL CONE BIOPSY    . CESAREAN SECTION     x2  . SALPINGOOPHORECTOMY  07/30/2012   Procedure: SALPINGO OOPHORECTOMY;  Surgeon: Alvino Chapel, MD;  Location: [facility];  Service: Gynecology;  Laterality: Left;     . TUBAL LIGATION     Hukal clips     There were no vitals filed for this visit.   Subjective Assessment - 12/09/19 1153    Subjective  I fell on my bottom in August 2020 on wood floor in home. Pain resolved and I had a couple of fever episodes but tested negative for COVID.  Went to [employer] in mid january and bending over much of the time and I restrained this problem.  I again tried to use a foam pillow.  had x rays and no fx with xray.  MRI March 23rd seen inflammation,  April 8th MRI with contrast    Pertinent History  DM, GERD, HTN,DDD lumbosacral, arthriits ACDF    Limitations  Sitting    How long can you sit comfortably?  8 minutes    How long can you stand comfortably?  unlimite but pain ful    How long can you walk comfortably?  unlimited painful    Diagnostic tests  xray taken Feb 10th    Patient Stated Goals  No pain and retrun to travel to the beach. and cant bend    Currently in Pain?  Yes    Pain Score  5     Pain Location  Back   coccyx   Pain Orientation  Right   piriformis   Pain Descriptors / Indicators  Shooting;Sharp    Pain Type  Chronic pain    Pain Onset  More than a month ago  Pain Frequency  Constant    Aggravating Factors   bending over and sitting for more than 5 minutes.  unable to go and eat in restaurants    Pain Relieving Factors  none,         [facility] PT Assessment - 12/09/19 0001      Assessment   Medical Diagnosis  low back pain lumbosacral area , coccyx pain pain in paraspinal region    Referring Provider (PT)  Havery Moros MD    Onset Date/Surgical Date  04/10/19    Prior Therapy  none      Precautions   Precautions  None      Restrictions   Weight Bearing Restrictions  No      Balance Screen   Has the patient fallen in the past 6 months  Yes    How many times?  fell in August 2020 on wood floor    Has the patient had a decrease in activity level because of a fear of falling?   No    Is the patient reluctant to leave their home because of a fear of falling?   No      Prior Function   Level of Independence  Independent    Vocation  Retired    Biomedical scientist  used to work for [employer]   Overall Cognitive Status  Within [employer] for tasks assessed      Observation/Other  Assessments   Focus on Therapeutic Outcomes (FOTO)   FOTO intake 32% limitation 68% predicted 46%      Posture/Postural Control   Posture/Postural Control  Postural limitations    Postural Limitations  Rounded Shoulders;Forward head;Anterior pelvic tilt      ROM / Strength   AROM / PROM / Strength  AROM;Strength      AROM   Overall AROM   Deficits    Right/Left Hip  Right;Left    Right Hip External Rotation   25   approximate   Right Hip Internal Rotation   20   approximate   Left Hip External Rotation   45   approximate   Left Hip Internal Rotation   45   approximate   Lumbar Flexion  limited 50 %    finger tips to floor 8 and 1/4 inches   Lumbar Extension  limited 50%    ERP   Lumbar - Right Side Bend  limited 50%   limted ERP   Lumbar - Left Side Bend  limited 25 %   limited ERP   Lumbar - Right Rotation  limited 20%    Lumbar - Left Rotation  limimted 20%      Strength   Overall Strength  Deficits    Right Hip Flexion  4/5    Right Hip Extension  4-/5    Right Hip ABduction  4-/5    Left Hip Flexion  4+/5      Palpation   Palpation comment  Marked TTP over coccyx.  posterior ILA RT sacrum and TTP RT hip flexor tightness. RT piriformis tightness , distraction of RTleg with initial pain but then decrease in pain      Special Tests    Special Tests  Hip Special Tests      Marcello Moores Test    Findings  Positive    Side  Right    Comments  Pt with marked pain in coccyx and RT SI ILA  Objective measurements completed on examination: See above findings.      [facility] Adult PT Treatment/Exercise - 12/09/19 0001      Self-Care   Self-Care  Other Self-Care Comments    Other Self-Care Comments   education on TPDN and aftercare and precautiions      Knee/Hip Exercises: Stretches   Piriformis Stretch  Right;5 reps;30 seconds    Piriformis Stretch Limitations  gave 3 versions sitting, supine and  figure 4 with piriformis stretch              PT Education - 12/09/19 1304    Education Details  POC Explanation findings education on TPDN and initial HEP    Person(s) Educated  Patient    Methods  Explanation;Demonstration;Tactile cues;Verbal cues;Handout    Comprehension  Verbalized understanding;Returned demonstration       PT Short Term Goals - 12/09/19 1332      PT SHORT TERM GOAL #1   Title  STG=LTG        PT Long Term Goals - 12/09/19 1236      PT LONG TERM GOAL #1   Title  Pt will be independent with advanced HEP.    Time  6    Period  Weeks    Status  New    Target Date  01/20/20      PT LONG TERM GOAL #2   Title  FOTO will improve from  68%limitation    to  46% limitation   indicating improved functional mobility.    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 Pt was very talkative and labile during the RX with TPDN in which she consented. Pt spoke of being frustrated with  pain. Pt was closely monitored during RX for TPDN to RT piriformis and sacrql paraspinal. Pt has immediate relief of pain and cried tears of relief.  Pt was positivie with RT Marcello Moores  test and Rt piriformis tightness/tightness in sacral/lumbar paraspinals.  Pt will benefit from skilled PT to address impairments in pain, inability to flex forward, muscle tightness and weakness or sit for greater than 8 minutes and return to painfree PLOF    Personal Factors and Comorbidities  Comorbidity 1    Comorbidities  DM, GERD, HTN,DDD lumbosacral, arthriits, anxiety, ACDF    Examination-Activity Limitations  Bend;Dressing;Sit;Squat    Examination-Participation Restrictions  Cleaning    Stability/Clinical Decision Making  Evolving/Moderate complexity    Clinical Decision Making  Moderate    Rehab Potential  Good    PT Frequency  2x / week    PT Duration  6 weeks    PT Treatment/Interventions  ADLs/Self Care Home Management;Cryotherapy;Electrical Stimulation;Traction;Ultrasound;Moist Heat;Iontophoresis 4mg /ml Dexamethasone;Therapeutic exercise;Therapeutic activities;Functional mobility training;Neuromuscular re-education;Patient/family education;Passive range of motion;Manual techniques;Dry needling;Taping;Joint Manipulations;Spinal Manipulations    PT Next Visit Plan  Assess TPDN,  hip flexor stretch, reviewe priformis and basic back abdominal engagement    PT Home Exercise Plan  piriformis stretch 3 variations    Consulted and Agree with Plan of Care  Patient       Patient will benefit from skilled therapeutic intervention in order to improve the following deficits and impairments:  Pain, Postural dysfunction, Improper body mechanics, Decreased strength, Decreased range of motion, Increased muscle spasms  Visit Diagnosis: Low back pain with right-sided sciatica, unspecified back pain laterality, unspecified chronicity  Cramp and spasm  Muscle weakness (generalized)     Problem List Patient Active  Problem List   Diagnosis Date Noted  . Diabetes ([place]) 01/26/2015  . Pelvic mass 07/18/2012    Voncille Lo, PT Certified Exercise Expert for the Aging Adult  12/09/19 3:36 PM Phone: [phone] Fax: [facility] [address] [place], [place], [address] Phone: [phone]   Fax:  [phone]  Name: Barbara Lynch MRN: [id number] Date of Birth: 10-Oct-1960

## 2019-12-09 NOTE — Patient Instructions (Signed)
Trigger Point Dry Needling  . What is Trigger Point Dry Needling (DN)? o DN is a physical therapy technique used to treat muscle pain and dysfunction. Specifically, DN helps deactivate muscle trigger points (muscle knots).  o A thin filiform needle is used to penetrate the skin and stimulate the underlying trigger point. The goal is for a local twitch response (LTR) to occur and for the trigger point to relax. No medication of any kind is injected during the procedure.   . What Does Trigger Point Dry Needling Feel Like?  o The procedure feels different for each individual patient. Some patients report that they do not actually feel the needle enter the skin and overall the process is not painful. Very mild bleeding may occur. However, many patients feel a deep cramping in the muscle in which the needle was inserted. This is the local twitch response.   Marland Kitchen How Will I feel after the treatment? o Soreness is normal, and the onset of soreness may not occur for a few hours. Typically this soreness does not last longer than two days.  o Bruising is uncommon, however; ice can be used to decrease any possible bruising.  o In rare cases feeling tired or nauseous after the treatment is normal. In addition, your symptoms may get worse before they get better, this period will typically not last longer than 24 hours.   . What Can I do After My Treatment? o Increase your hydration by drinking more water for the next 24 hours. o You may place ice or heat on the areas treated that have become sore, however, do not use heat on inflamed or bruised areas. Heat often brings more relief post needling. o You can continue your regular activities, but vigorous activity is not recommended initially after the treatment for 24 hours. o DN is best combined with other physical therapy such as strengthening, stretching, and other therapies.    Piriformis (Supine)  Cross legs, right on top. Gently pull other knee toward  chest until stretch is felt in buttock/hip of top leg. Hold _30___ seconds. Repeat _3___ times per set. Do ___1_ sets per session. Do __3__ sessions of ONE of the piriformis exericises  Hip Stretch  Put right ankle over left knee. Let right knee fall downward, but keep ankle in place. Feel the stretch in hip. May push down gently with hand to feel stretch. Hold _30___ seconds while counting out loud. Repeat with other leg Do __3__ sessions of ONE of the piriformis exericises Repeat __3__ times.     Stretching: Piriformis (Supine)  Pull right knee toward opposite shoulder. Hold _30___ seconds. Relax. Repeat __3__ times per set. Do __1__ sets per session. Do __3__ sessions of ONE of the piriformis exericises       Voncille Lo, PT Certified Exercise Expert for the Aging Adult  12/09/19 12:21 PM Phone: [phone] Fax: [phone]

## 2019-12-12 ENCOUNTER — Other Ambulatory Visit: Payer: Self-pay

## 2019-12-12 ENCOUNTER — Encounter: Payer: Self-pay | Admitting: Physical Therapy

## 2019-12-12 ENCOUNTER — Ambulatory Visit: Payer: Self-pay | Admitting: Physical Therapy

## 2019-12-12 DIAGNOSIS — R252 Cramp and spasm: Secondary | ICD-10-CM

## 2019-12-12 DIAGNOSIS — M5441 Lumbago with sciatica, right side: Secondary | ICD-10-CM

## 2019-12-12 DIAGNOSIS — M6281 Muscle weakness (generalized): Secondary | ICD-10-CM

## 2019-12-12 NOTE — Therapy (Signed)
[place], [place], [address] Phone: [phone]   Fax:  [phone]  Physical Therapy Treatment  Patient Details  Name: Barbara Lynch MRN: [id number] Date of Birth: Mar 29, 1961 Referring Provider (PT): Havery Moros MD   Encounter Date: 12/12/2019  PT End of Session - 12/12/19 1015    Visit Number  2    Number of Visits  13    Date for PT Re-Evaluation  01/20/20    Authorization Type  SELF pay    PT Start Time  1015    PT Stop Time  1055    PT Time Calculation (min)  40 min    Activity Tolerance  Patient tolerated treatment well;Patient limited by pain    Behavior During Therapy  [facility] for tasks assessed/performed       Past Medical History:  Diagnosis Date  . Abnormal Pap smear   . Anginal pain ([place])    " panic attack" and chest tight- 1 episode   . Anxiety   . Arthritis    pinched nerve in neck   . Bronchitis   . Deviated septum   . Diabetes mellitus without complication ([place])   . GERD (gastroesophageal reflux disease)   . Hypertension   . Hypertension   . IBS (irritable bowel syndrome)   . Left hip pain    also in left leg- ? etiology   . Nasal polyp    right nostril   . Urinary tract infection    hx of     Past Surgical History:  Procedure Laterality Date  . ABDOMINAL HYSTERECTOMY     abdominal hysterectomy   . ANTERIOR CERVICAL DECOMP/DISCECTOMY FUSION    . CERVICAL CONE BIOPSY    . CESAREAN SECTION     x2  . SALPINGOOPHORECTOMY  07/30/2012   Procedure: SALPINGO OOPHORECTOMY;  Surgeon: Alvino Chapel, MD;  Location: [facility];  Service: Gynecology;  Laterality: Left;     . TUBAL LIGATION     Hukal clips     There were no vitals filed for this visit.  Subjective Assessment - 12/12/19 1016    Subjective  "I was good after the first 4 hours following the needling session, but the pain went back to normal." Patient is not able to sit down due to pain. She  reports that she hasn't been doing well.    Pertinent History  DM, GERD, HTN,DDD lumbosacral, arthriits ACDF    Limitations  Sitting    How long can you sit comfortably?  8 minutes    How long can you stand comfortably?  unlimite but pain ful    How long can you walk comfortably?  unlimited painful    Diagnostic tests  xray taken Feb 10th    Patient Stated Goals  No pain and retrun to travel to the beach. and cant bend    Currently in Pain?  Yes    Pain Score  4    Took gabapentin   Pain Location  Back    Pain Orientation  Right    Pain Descriptors / Indicators  Shooting;Sharp    Pain Type  Chronic pain    Pain Onset  More than a month ago    Pain Frequency  Constant    Aggravating Factors   bending over and sitting for more than 5 minutes. unable to go and eat in restaurants    Pain Relieving Factors  none  [facility] Adult PT Treatment/Exercise - 12/12/19 0001      Manual Therapy   Manual therapy comments  Skilled palpation and soft tissue massage        Trigger Point Dry Needling - 12/12/19 0001    Consent Given?  Yes    Education Handout Provided  Previously provided    Dry Needling Comments  Sacral Paraspinals            PT Education - 12/12/19 1056    Education Details  Patient was educated on pelvic floor therapy and additional findings    Person(s) Educated  Patient    Methods  Explanation;Demonstration    Comprehension  Verbalized understanding       PT Short Term Goals - 12/09/19 1332      PT SHORT TERM GOAL #1   Title  STG=LTG        PT Long Term Goals - 12/09/19 1236      PT LONG TERM GOAL #1   Title  Pt will be independent with advanced HEP.    Time  6    Period  Weeks    Status  New    Target Date  01/20/20      PT LONG TERM GOAL #2   Title  FOTO will improve from  68%limitation    to  46% limitation   indicating improved functional mobility.    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 She reported that she felt better with performing a glute bridge (muscles contracted). No LLD or pelvic rotation found. Patient indicated that pressure over pelvic ring hurt. She responded favorably to DN. She describes symptoms consistent with pelvic floor complications. Given her presentations, I believe that she would be better suited for pelvic floor therapy. Her appointments will be tranferred to pelvic floor clinic in order to better treat her conditions.    Personal Factors and Comorbidities  Comorbidity 1    Comorbidities  DM, GERD, HTN,DDD lumbosacral, arthriits, anxiety, ACDF    Examination-Activity Limitations  Bend;Dressing;Sit;Squat    Examination-Participation Restrictions  Cleaning    Stability/Clinical Decision Making  Evolving/Moderate complexity  Clinical Decision Making  Moderate    Rehab Potential  Good    PT Frequency  2x / week    PT Duration  6 weeks    PT Treatment/Interventions  ADLs/Self Care Home Management;Cryotherapy;Electrical Stimulation;Traction;Ultrasound;Moist Heat;Iontophoresis 4mg /ml Dexamethasone;Therapeutic exercise;Therapeutic  activities;Functional mobility training;Neuromuscular re-education;Patient/family education;Passive range of motion;Manual techniques;Dry needling;Taping;Joint Manipulations;Spinal Manipulations    PT Next Visit Plan  Assess TPDN,  hip flexor stretch, reviewe priformis and basic back abdominal engagement    PT Home Exercise Plan  piriformis stretch 3 variations    Consulted and Agree with Plan of Care  Patient       Patient will benefit from skilled therapeutic intervention in order to improve the following deficits and impairments:  Pain, Postural dysfunction, Improper body mechanics, Decreased strength, Decreased range of motion, Increased muscle spasms  Visit Diagnosis: Low back pain with right-sided sciatica, unspecified back pain laterality, unspecified chronicity  Cramp and spasm  Muscle weakness (generalized)     Problem List Patient Active Problem List   Diagnosis Date Noted  . Diabetes ([place]) 01/26/2015  . Pelvic mass 07/18/2012    Laveda Norman, SPT 12/12/2019, 11:23 AM  [facility] [address] [place], [place], [address] Phone: [phone]   Fax:  [phone]  Name: Barbara Lynch MRN: [id number] Date of Birth: Mar 27, 1961

## 2019-12-15 ENCOUNTER — Other Ambulatory Visit: Payer: Self-pay

## 2019-12-15 ENCOUNTER — Encounter: Payer: Self-pay | Admitting: Physical Therapy

## 2019-12-15 ENCOUNTER — Ambulatory Visit: Payer: Self-pay | Admitting: Physical Therapy

## 2019-12-15 DIAGNOSIS — R252 Cramp and spasm: Secondary | ICD-10-CM

## 2019-12-15 DIAGNOSIS — M5441 Lumbago with sciatica, right side: Secondary | ICD-10-CM

## 2019-12-15 DIAGNOSIS — M6281 Muscle weakness (generalized): Secondary | ICD-10-CM

## 2019-12-15 NOTE — Therapy (Signed)
[place], [place], [address] Phone: [phone]   Fax:  [phone]  Physical Therapy Treatment  Patient Details  Name: Barbara Lynch MRN: [id number] Date of Birth: 1960-12-27 Referring Provider (PT): Havery Moros MD   Encounter Date: 12/15/2019  PT End of Session - 12/15/19 1332    Visit Number  3    Number of Visits  13    Date for PT Re-Evaluation  01/20/20    Authorization Type  SELF pay    PT Start Time  [id number]    PT Stop Time  1411    PT Time Calculation (min)  38 min    Activity Tolerance  Patient tolerated treatment well;Patient limited by pain    Behavior During Therapy  [facility] for tasks assessed/performed       Past Medical History:  Diagnosis Date  . Abnormal Pap smear   . Anginal pain ([place])    " panic attack" and chest tight- 1 episode   . Anxiety   . Arthritis    pinched nerve in neck   . Bronchitis   . Deviated septum   . Diabetes mellitus without complication ([place])   . GERD (gastroesophageal reflux disease)   . Hypertension   . Hypertension   . IBS (irritable bowel syndrome)   . Left hip pain    also in left leg- ? etiology   . Nasal polyp    right nostril   . Urinary tract infection    hx of     Past Surgical History:  Procedure Laterality Date  . ABDOMINAL HYSTERECTOMY     abdominal hysterectomy   . ANTERIOR CERVICAL DECOMP/DISCECTOMY FUSION    . CERVICAL CONE BIOPSY    . CESAREAN SECTION     x2  . SALPINGOOPHORECTOMY  07/30/2012   Procedure: SALPINGO OOPHORECTOMY;  Surgeon: Alvino Chapel, MD;  Location: [facility];  Service: Gynecology;  Laterality: Left;     . TUBAL LIGATION     Hukal clips     There were no vitals filed for this visit.  Subjective Assessment - 12/15/19 1335    Subjective  "I have been doing a lot better. I was able to get out in my garden. What yall did helped. I was able to walk my driveway 5 times since I was last here. My  pain has stayed around a 6. I cooked a meal." Patient feels as if she is getting better. Physician wants her to continue with outpatient PT before going to pelvic floor therapy.    Pertinent History  DM, GERD, HTN,DDD lumbosacral, arthriits ACDF    Limitations  Sitting    How long can you sit comfortably?  8 minutes    How long can you stand comfortably?  unlimite but painful    How long can you walk comfortably?  unlimited painful    Diagnostic tests  xray taken Feb 10th    Patient Stated Goals  No pain and retrun to travel to the beach. and cant bend    Currently in Pain?  Yes    Pain Score  3     Pain Location  Back    Pain Orientation  Right    Pain Descriptors / Indicators  Sharp;Shooting    Pain Type  Chronic pain    Pain Onset  More than a month ago    Pain Frequency  Constant    Aggravating Factors   bending over and sitting  for more than 5 minutes. unable to go and eat in restaurants    Pain Relieving Factors  DN         [facility] PT Assessment - 12/15/19 0001      AROM   Lumbar Flexion  Patient able to touch floor                   [facility] Adult PT Treatment/Exercise - 12/15/19 0001      Exercises   Exercises  Lumbar;Knee/Hip      Lumbar Exercises: Stretches   Lower Trunk Rotation  60 seconds;2 reps    Pelvic Tilt  20 reps   Anterior pelvic tilt    Piriformis Stretch  Left;Right;1 rep;30 seconds    Other Lumbar Stretch Exercise  Glute Stretch, 30 sec, Both       Lumbar Exercises: Supine   Bridge  20 reps      Manual Therapy   Manual Therapy  Soft tissue mobilization    Manual therapy comments  Trigger Point Manual Therapy             PT Education - 12/15/19 1459    Education Details  Patient educated on importance of performing HEP, new exercises, and limitations    Person(s) Educated  Patient    Methods  Explanation;Demonstration;Handout    Comprehension  Verbalized understanding       PT Short Term Goals - 12/09/19 1332      PT SHORT  TERM GOAL #1   Title  STG=LTG        PT Long Term Goals - 12/09/19 1236      PT LONG TERM GOAL #1   Title  Pt will be independent with advanced HEP.    Time  6    Period  Weeks    Status  New    Target Date  01/20/20      PT LONG TERM GOAL #2   Title  FOTO will improve from  68%limitation    to  46% limitation   indicating improved functional mobility.    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 She reports an increase in activity over the weekend. We discusssed trying a session w/o DN to see how the exercises hold up. Patient was able to tolerate the increase in activity. She stated that her physician wanted her to hold off pelvic floor PT until she finished her sessions with [place]. She would benefit from PT in order to further address pain.    Personal Factors and Comorbidities  Comorbidity 1    Comorbidities  DM, GERD, HTN,DDD lumbosacral, arthriits, anxiety, ACDF    Examination-Activity Limitations  Bend;Dressing;Sit;Squat    Examination-Participation Restrictions  Cleaning    Stability/Clinical Decision Making  Evolving/Moderate complexity     Clinical Decision Making  Moderate    Rehab Potential  Good    PT Duration  6 weeks    PT Treatment/Interventions  ADLs/Self Care Home Management;Cryotherapy;Electrical Stimulation;Traction;Ultrasound;Moist Heat;Iontophoresis 4mg /ml Dexamethasone;Therapeutic exercise;Therapeutic activities;Functional mobility training;Neuromuscular re-education;Patient/family education;Passive range of motion;Manual techniques;Dry needling;Taping;Joint Manipulations;Spinal Manipulations    PT Next Visit Plan  Assess TPDN,  hip flexor stretch, reviewe priformis and basic back abdominal engagement    PT Home Exercise Plan  piriformis stretch 3 variations    Consulted and Agree with Plan of Care  Patient       Patient will benefit from skilled therapeutic intervention in order to improve the following deficits and impairments:  Pain, Postural dysfunction, Improper body mechanics, Decreased strength, Decreased range of motion, Increased muscle spasms  Visit Diagnosis: Low back pain with right-sided sciatica, unspecified back pain laterality, unspecified chronicity  Cramp and spasm  Muscle weakness (generalized)     Problem List Patient Active Problem List   Diagnosis Date Noted  . Diabetes ([place]) 01/26/2015  . Pelvic mass 07/18/2012    Laveda Norman, SPT 12/15/2019, 3:06 PM  [facility] [address] [place], [place], [address] Phone: [phone]   Fax:  [phone]  Name: KEILIE MUSCH MRN: [id number] Date of Birth: 04-Feb-1961

## 2019-12-25 ENCOUNTER — Encounter: Payer: Self-pay | Admitting: Physical Therapy

## 2019-12-25 ENCOUNTER — Other Ambulatory Visit: Payer: Self-pay

## 2019-12-25 ENCOUNTER — Ambulatory Visit: Payer: Self-pay | Admitting: Physical Therapy

## 2019-12-25 DIAGNOSIS — R252 Cramp and spasm: Secondary | ICD-10-CM

## 2019-12-25 DIAGNOSIS — M5441 Lumbago with sciatica, right side: Secondary | ICD-10-CM

## 2019-12-25 DIAGNOSIS — M6281 Muscle weakness (generalized): Secondary | ICD-10-CM

## 2019-12-25 NOTE — Therapy (Addendum)
[place], [place], [address] Phone: [phone]   Fax:  [phone]  Physical Therapy Treatment/Discharge Note  Patient Details  Name: Barbara Lynch MRN: [id number] Date of Birth: July 22, 1961 Referring Provider (PT): Havery Moros MD   Encounter Date: 12/25/2019  PT End of Session - 12/25/19 1057    Visit Number  4    Number of Visits  13    Date for PT Re-Evaluation  01/20/20    Authorization Type  SELF pay    PT Start Time  1016    PT Stop Time  1050    PT Time Calculation (min)  34 min    Activity Tolerance  Patient tolerated treatment well;Patient limited by pain    Behavior During Therapy  [facility] for tasks assessed/performed       Past Medical History:  Diagnosis Date  . Abnormal Pap smear   . Anginal pain ([place])    " panic attack" and chest tight- 1 episode   . Anxiety   . Arthritis    pinched nerve in neck   . Bronchitis   . Deviated septum   . Diabetes mellitus without complication ([place])   . GERD (gastroesophageal reflux disease)   . Hypertension   . Hypertension   . IBS (irritable bowel syndrome)   . Left hip pain    also in left leg- ? etiology   . Nasal polyp    right nostril   . Urinary tract infection    hx of     Past Surgical History:  Procedure Laterality Date  . ABDOMINAL HYSTERECTOMY     abdominal hysterectomy   . ANTERIOR CERVICAL DECOMP/DISCECTOMY FUSION    . CERVICAL CONE BIOPSY    . CESAREAN SECTION     x2  . SALPINGOOPHORECTOMY  07/30/2012   Procedure: SALPINGO OOPHORECTOMY;  Surgeon: Alvino Chapel, MD;  Location: [facility];  Service: Gynecology;  Laterality: Left;     . TUBAL LIGATION     Hukal clips     There were no vitals filed for this visit.  Subjective Assessment - 12/25/19 1021    Subjective  I have had so so much pain after the deep tissue massage from the other day felt good after the RX but that night I noticed that all original  pain came back and intensified.  I was up every hour and on all my pain meds. I was so restless. i worked the next day and I was in pain all day even on my medication.  I was not able to do my exeicises , I think the exercises normally help. I could not do it for 2 days and the weekend I was able to do them again. My Dr Gwenyth Ober phillips want to do an injection on the nerves on May 10.    Pertinent History  DM, GERD, HTN,DDD lumbosacral, arthriits ACDF    Patient Stated Goals  No pain and retrun to travel to the beach. and cant bend    Currently in Pain?  Yes    Pain Score  5    internal pain has subsided   Pain Location  Back    Pain Orientation  Right                       [facility] Adult PT Treatment/Exercise - 12/25/19 0001      Exercises   Exercises  Lumbar;Knee/Hip  Lumbar Exercises: Stretches   Lower Trunk Rotation  60 seconds;2 reps    Pelvic Tilt  20 reps   Anterior pelvic tilt    Piriformis Stretch  Right;Left;4 reps;30 seconds    Other Lumbar Stretch Exercise  Glute Stretch, 30 sec, Both       Lumbar Exercises: Supine   Bridge  20 reps      Modalities   Modalities  Moist Heat      Moist Heat Therapy   Number Minutes Moist Heat  15 Minutes    Moist Heat Location  Lumbar Spine   sacrum     Manual Therapy   Manual Therapy  Soft tissue mobilization    Manual therapy comments  very light effleurage to calm down irritated tissues       Trigger Point Dry Needling - 12/25/19 0001    Consent Given?  Yes    Education Handout Provided  Previously provided    Dry Needling Comments  Sacral Paraspinals    bil  pt pain decreased on LT , eliminated on RT            PT Short Term Goals - 12/09/19 1332      PT SHORT TERM GOAL #1   Title  STG=LTG        PT Long Term Goals - 12/25/19 1026      PT LONG TERM GOAL #1   Title  Pt will be independent with advanced HEP.    Time  6    Period  Weeks    Status  On-going      PT LONG TERM GOAL #2    Title  FOTO will improve from  68%limitation    to  46% limitation   indicating improved functional mobility.    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 Pt had bad reaction to deep trigger point release last visit.  she was unable to sleep more than an hour or 2 at a time.  She had pain up to 8/10 throughout the next day and unable to  perform exercises for 2 days.  She is now 5/10 pain today and consents to TPDN. she is closely  monitored throughout session and had elimination of RT side sacral pain and reduced LT side sacral pain.  She is schedule for sacral lumbar injections on May 10.  she also was tearful and grieving the impending death of her mother in law and her only 52 yo granddaughter is presently in the hospitial with vomiting and undiagnosed problems.  Spent much time with emotional issues and caring for patient.  Will continue with POC for goal achievment in next visits.  TPDN seems to provide a good releif    Personal Factors and Comorbidities  Comorbidity 1    Comorbidities  DM, GERD, HTN,DDD lumbosacral, arthriits, anxiety, ACDF    Examination-Activity Limitations   Bend;Dressing;Sit;Squat    Examination-Participation Restrictions  Cleaning    PT Treatment/Interventions  ADLs/Self Care Home Management;Cryotherapy;Electrical Stimulation;Traction;Ultrasound;Moist Heat;Iontophoresis '4mg'$ /ml Dexamethasone;Therapeutic exercise;Therapeutic activities;Functional mobility training;Neuromuscular re-education;Patient/family education;Passive range of motion;Manual techniques;Dry needling;Taping;Joint Manipulations;Spinal Manipulations    PT Next Visit Plan  TPDN better than deep trigger point , pt to have lumbar injection May 10 and going out of town will schedule after May 10,  hip flexor stretch, reviewe priformis and basic back abdominal engagement    PT Home Exercise Plan  piriformis stretch 3 variations bridge       Patient will benefit from skilled therapeutic intervention in order to improve the following deficits and impairments:  Pain, Postural dysfunction, Improper body mechanics, Decreased strength, Decreased range of motion, Increased muscle spasms  Visit Diagnosis: Low back pain with right-sided sciatica, unspecified back pain laterality, unspecified chronicity  Cramp and spasm  Muscle weakness (generalized)     Problem List Patient Active Problem List   Diagnosis Date Noted  . Diabetes ([place]) 01/26/2015  . Pelvic mass 07/18/2012    Voncille Lo, PT Certified Exercise Expert for the Aging Adult  12/25/19 10:59 AM Phone: [phone] Fax: [facility] [address] [place], [place], [address] Phone: [phone]   Fax:  [phone]  Name: Barbara Lynch MRN: [id number] Date of Birth: 1961/01/29  PHYSICAL THERAPY DISCHARGE SUMMARY  Visits from Start of Care: 4  Current functional level related to goals / functional outcomes: unknown   Remaining deficits: unknown   Education / Equipment: Initial HEP Plan:                                                     Patient goals were not met. Patient is being discharged due to not returning since the last visit.  ?????    Voncille Lo, PT Certified Exercise Expert for the Aging Adult  03/30/20 4:27 PM Phone: [phone] Fax: [phone]

## 2019-12-30 ENCOUNTER — Encounter: Payer: Self-pay | Admitting: Physical Therapy

## 2020-01-09 ENCOUNTER — Ambulatory Visit: Payer: Self-pay | Admitting: Physical Therapy

## 2020-01-13 ENCOUNTER — Ambulatory Visit: Payer: Self-pay | Admitting: Physical Therapy

## 2020-02-17 ENCOUNTER — Other Ambulatory Visit: Payer: Self-pay | Admitting: Obstetrics and Gynecology

## 2020-02-17 ENCOUNTER — Ambulatory Visit
Admission: RE | Admit: 2020-02-17 | Discharge: 2020-02-17 | Disposition: A | Discharge status: Home / Self Care | Payer: Self-pay | Source: Ambulatory Visit | Attending: Obstetrics and Gynecology | Admitting: Obstetrics and Gynecology

## 2020-02-17 DIAGNOSIS — Z1231 Encounter for screening mammogram for malignant neoplasm of breast: Secondary | ICD-10-CM

## 2021-08-28 IMAGING — MG DIGITAL SCREENING BILAT W/ TOMO W/ CAD
8 series · 8 of 24 positions shown · non-contrast
Comparison: Previous exam(s).

CLINICAL DATA: Screening.

EXAM:
DIGITAL SCREENING BILATERAL MAMMOGRAM WITH TOMO AND CAD

[R MLO synth-2D]
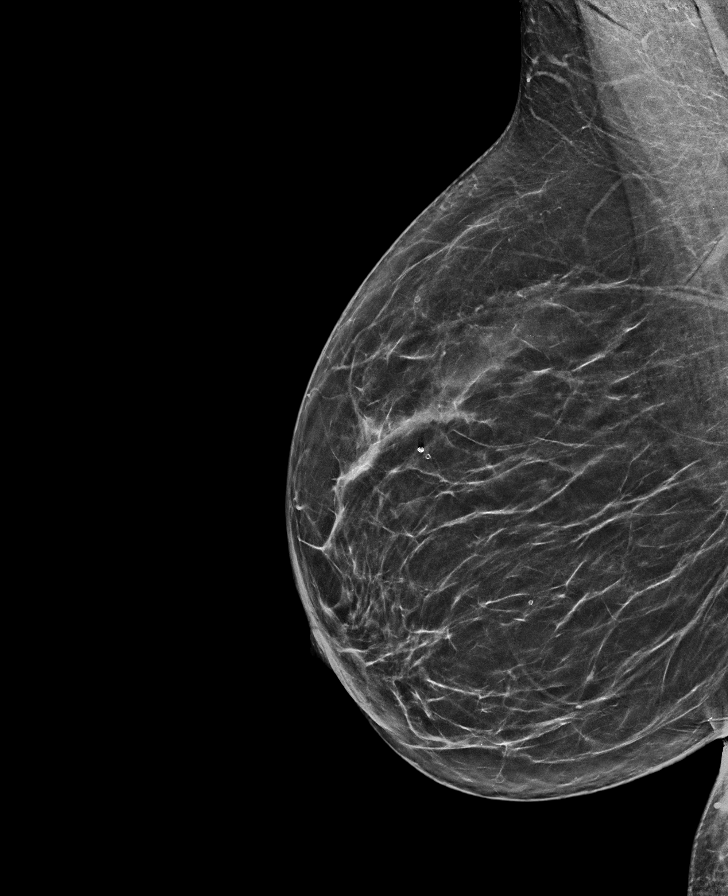

[L CC synth-2D]
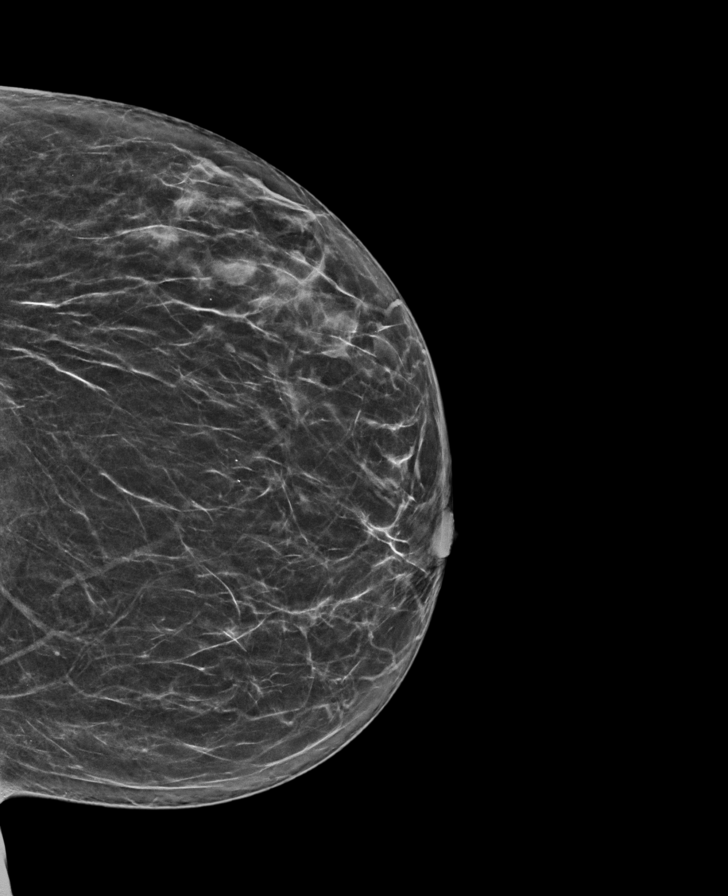

[L MLO synth-2D]
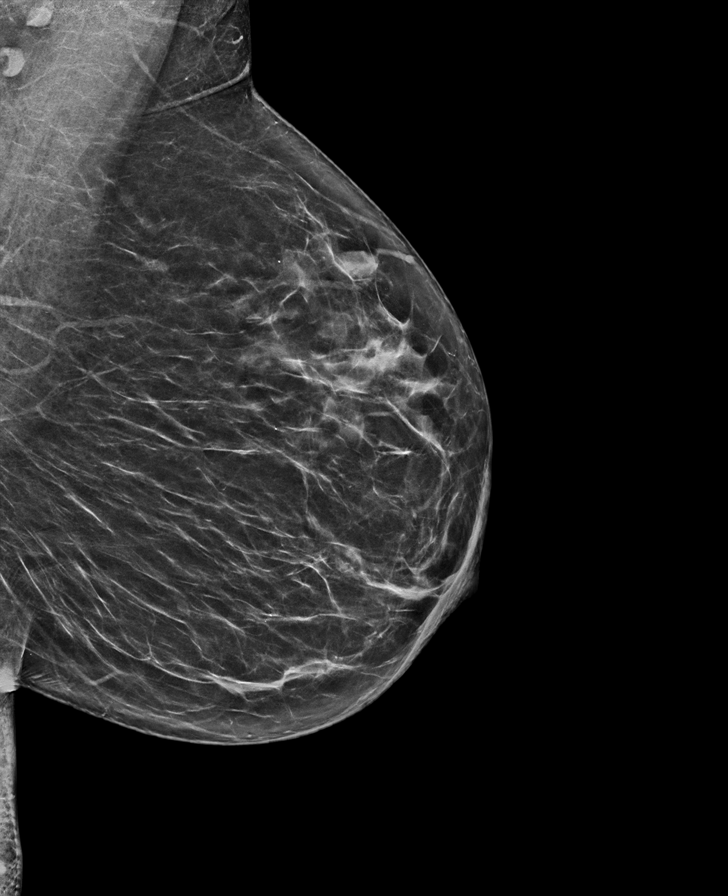

[R CC synth-2D]
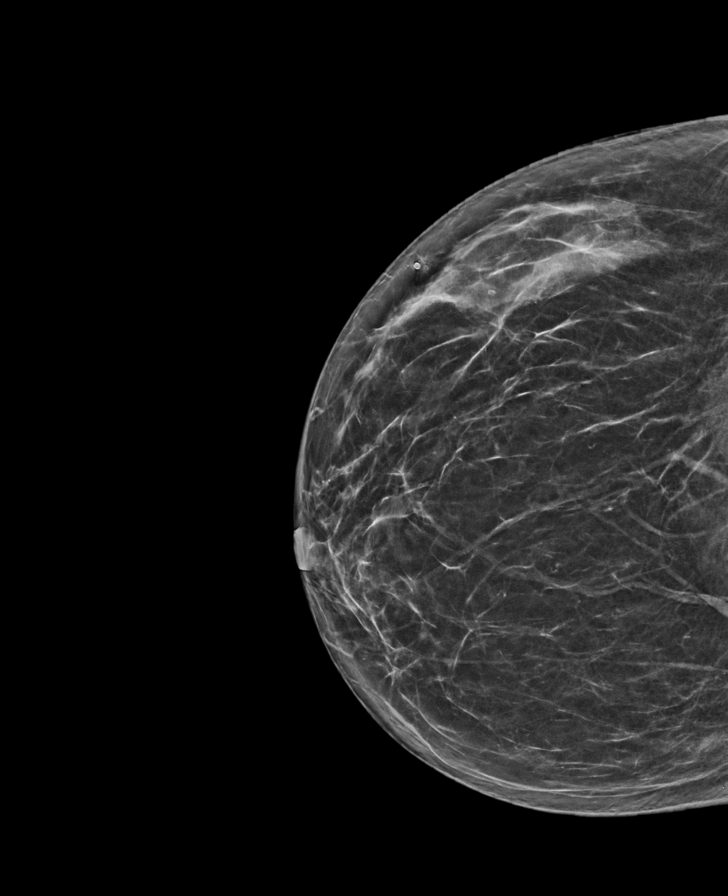

[L MLO tomo · tomo slice 35/68.0]
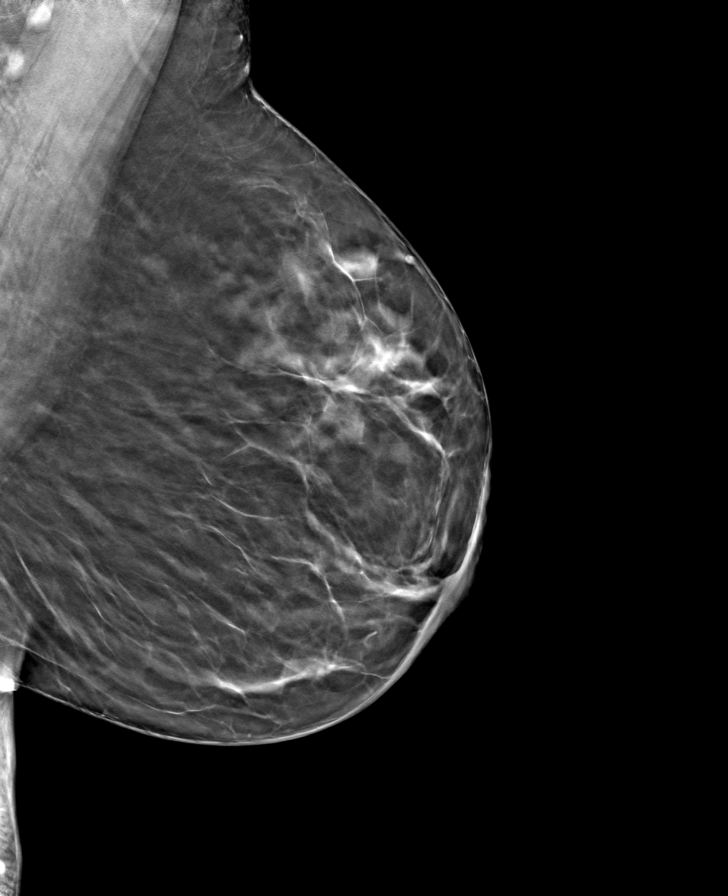

[R CC tomo · tomo slice 31/60.0]
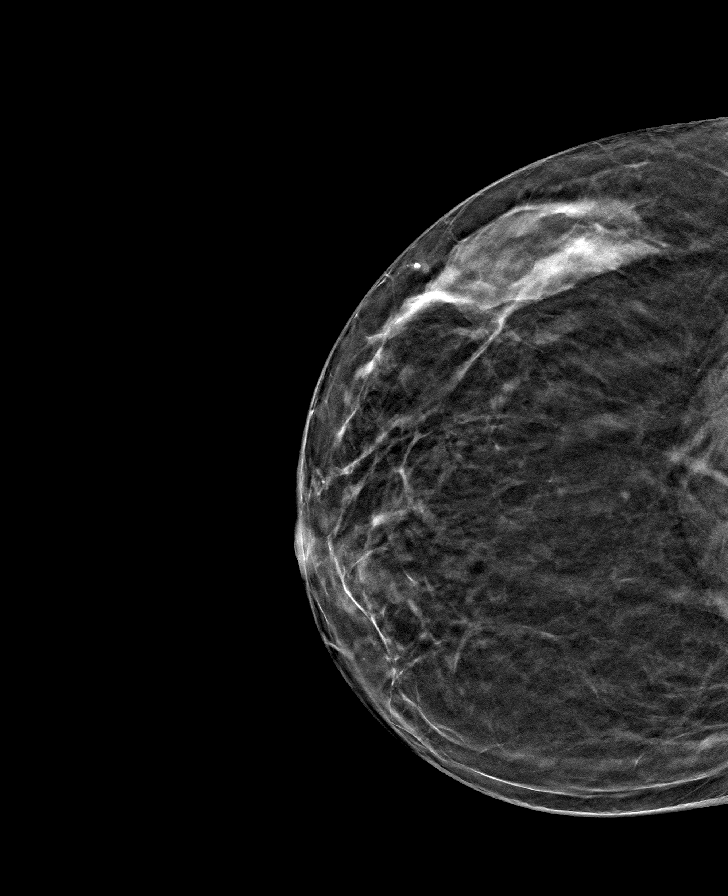

[R MLO tomo · tomo slice 33/64.0]
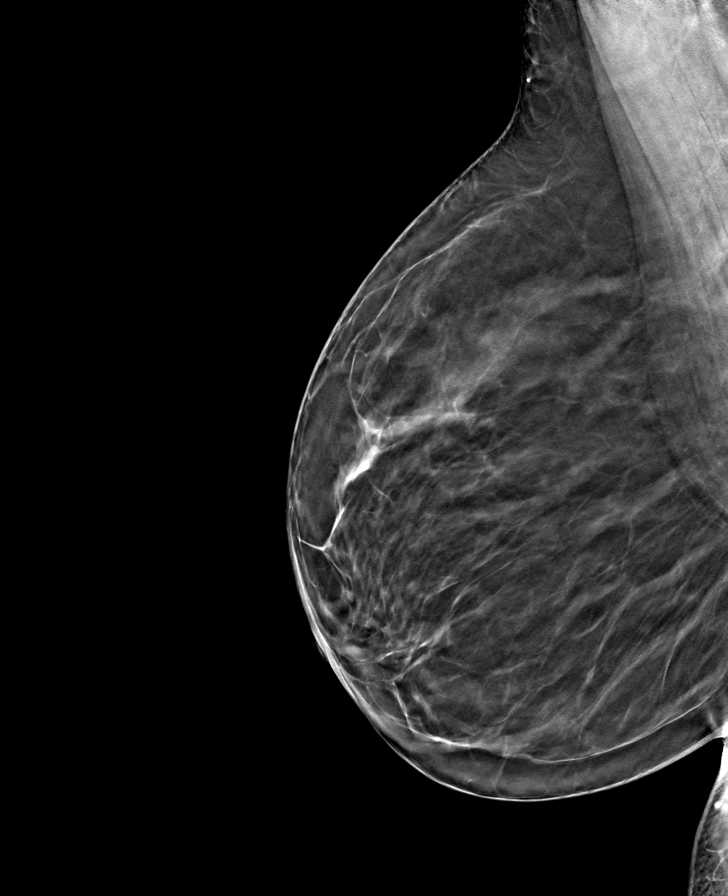

[L CC tomo · tomo slice 30/59.0]
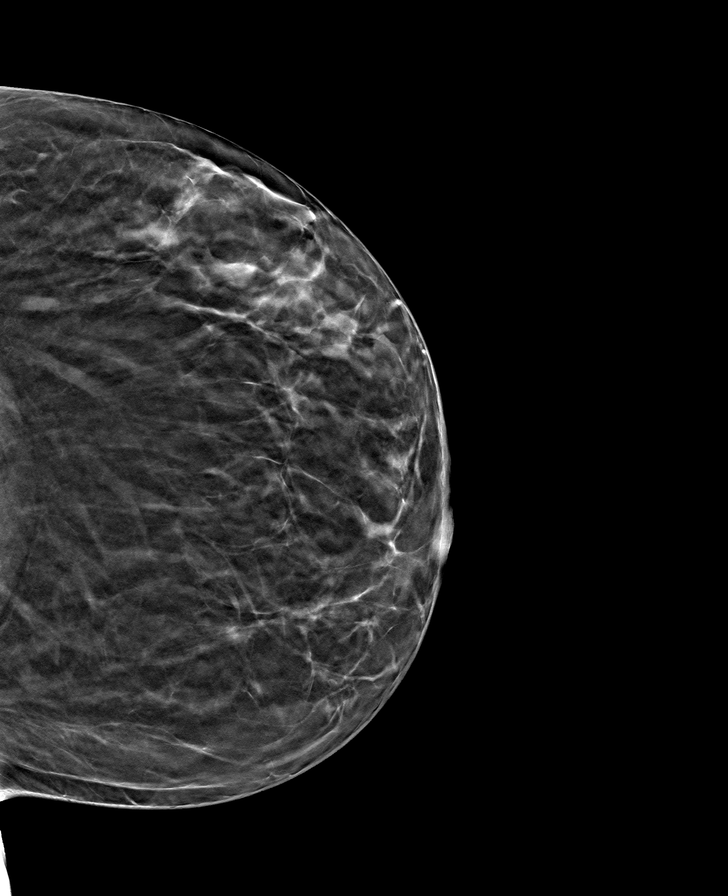

[8 of 24 positions shown; findings below may reference images not displayed]

ACR Breast Density Category b: There are scattered areas of
fibroglandular density.
FINDINGS: There are no findings suspicious for malignancy. Images were
processed with CAD.
IMPRESSION: No mammographic evidence of malignancy. A result letter of this
screening mammogram will be mailed directly to the patient.

RECOMMENDATION:
Screening mammogram in one year. (Code:[id number])

BI-RADS CATEGORY  1: Negative.

## 2023-06-06 ENCOUNTER — Other Ambulatory Visit: Payer: Self-pay | Admitting: Obstetrics and Gynecology

## 2023-06-06 DIAGNOSIS — Z1231 Encounter for screening mammogram for malignant neoplasm of breast: Secondary | ICD-10-CM
